# Patient Record
Sex: Male | Born: 1965 | Race: White | Hispanic: No | Marital: Single
Health system: Midwestern US, Academic
[De-identification: ages and names within clinical notes are randomized; demographics above are authoritative.]

---

## 2015-11-18 IMAGING — CT Head^1_WITH_WITHOUT_HEAD (Adult)
1 of 2 series · 13 of 30 positions shown, 17 images · IV contrast (APPLIED)
Comparison: none

[Series 1: brain w/o 4.8 brain · axial · non-contrast · 0.55mm/px · z∈[+128,+243]mm · 13 of 30 slices shown, 17 images]
[im 3/30  brain]
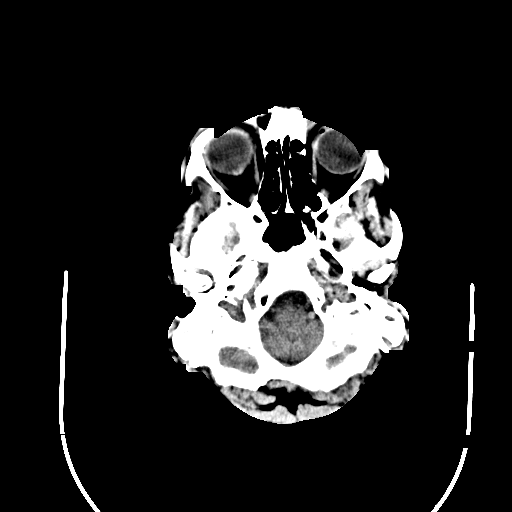
[im 3/30  bone]
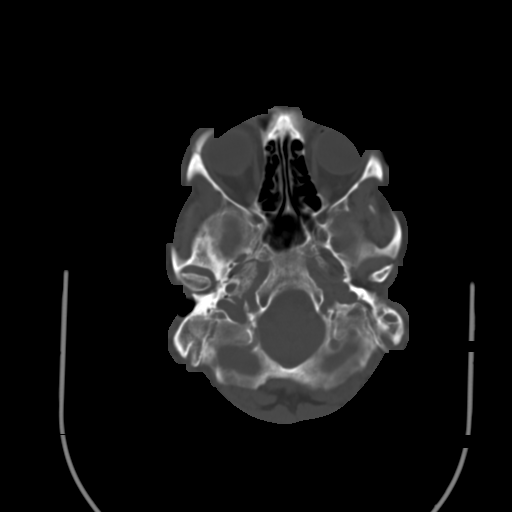
[im 5/30  brain]
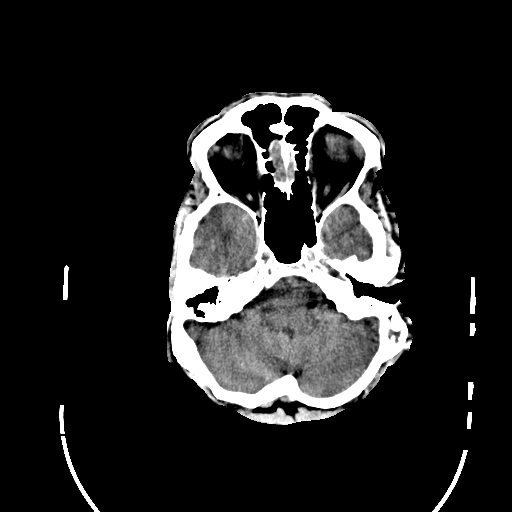
[im 7/30  brain]
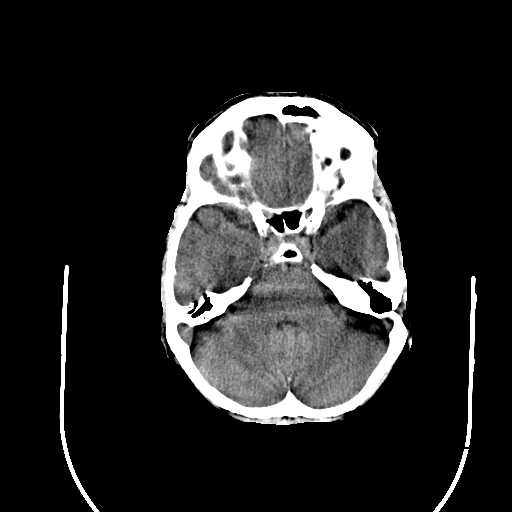
[im 9/30  brain]
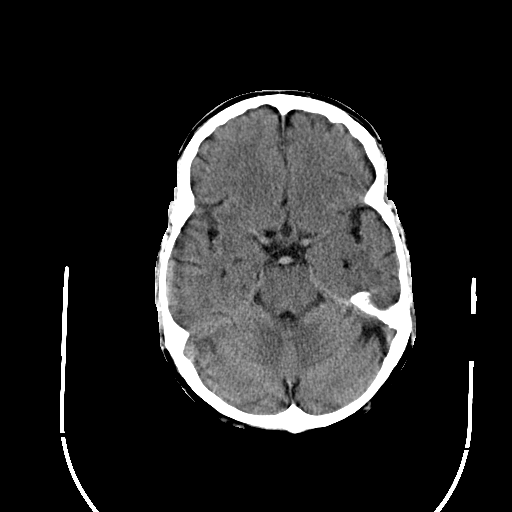
[im 11/30  brain]
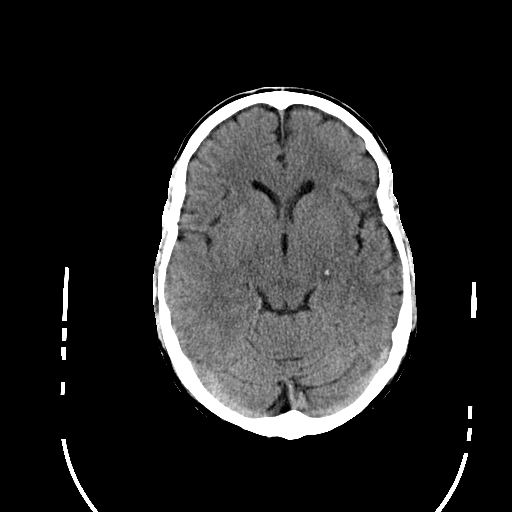
[im 11/30  bone]
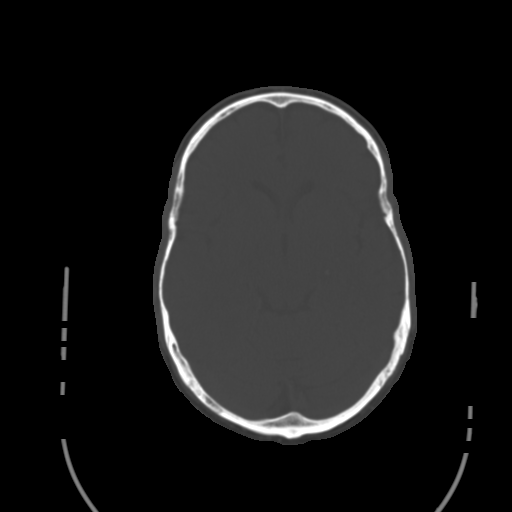
[im 13/30  brain]
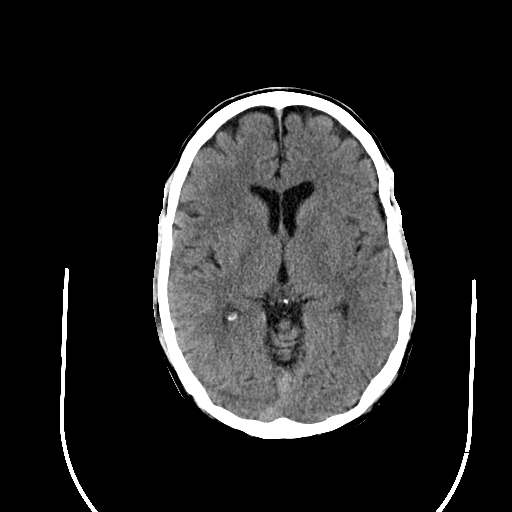
[im 15/30  brain]
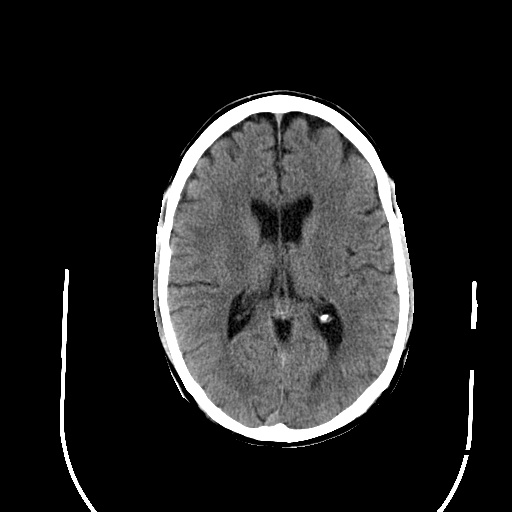
[im 17/30  brain]
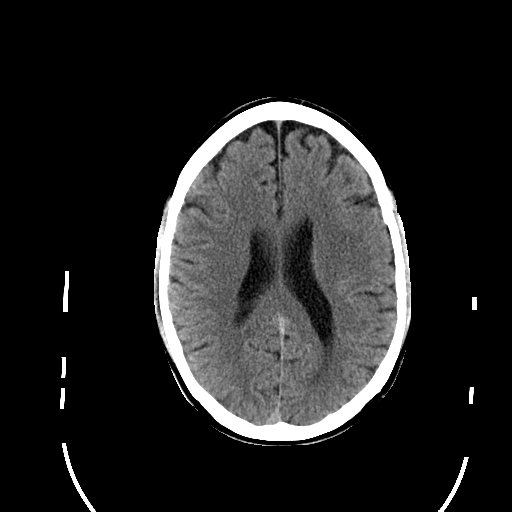
[im 19/30  brain]
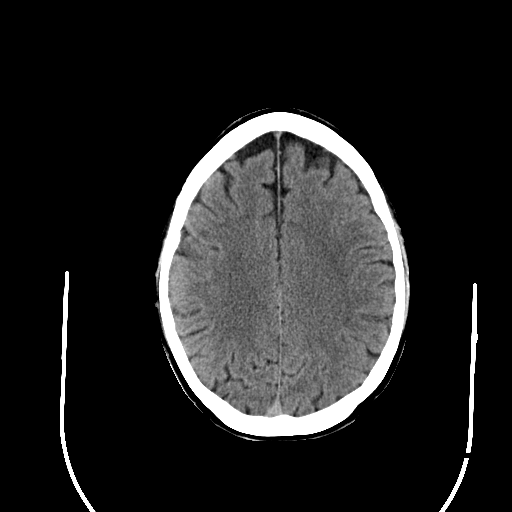
[im 19/30  bone]
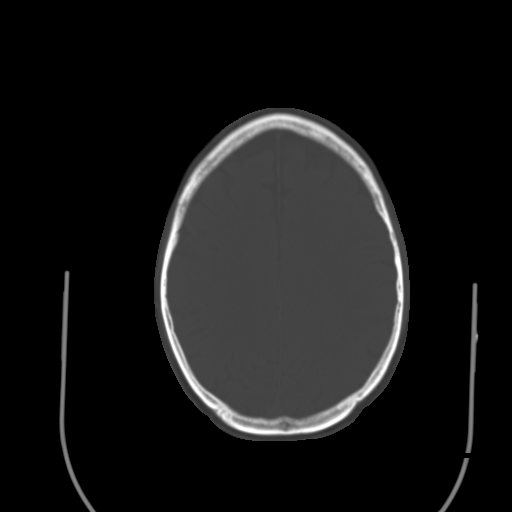
[im 21/30  brain]
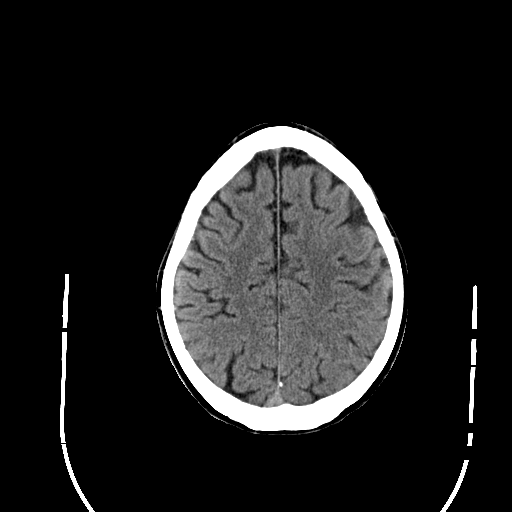
[im 23/30  brain]
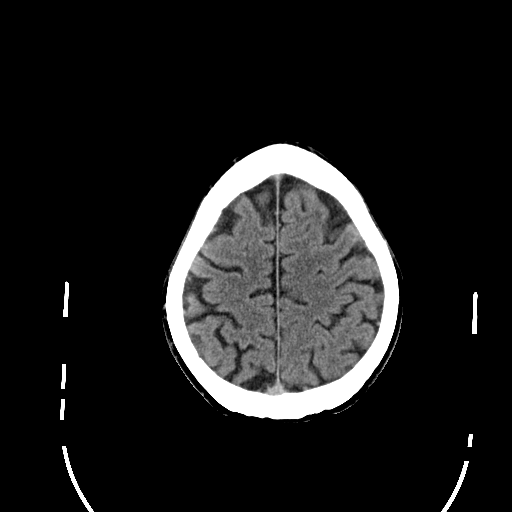
[im 25/30  brain]
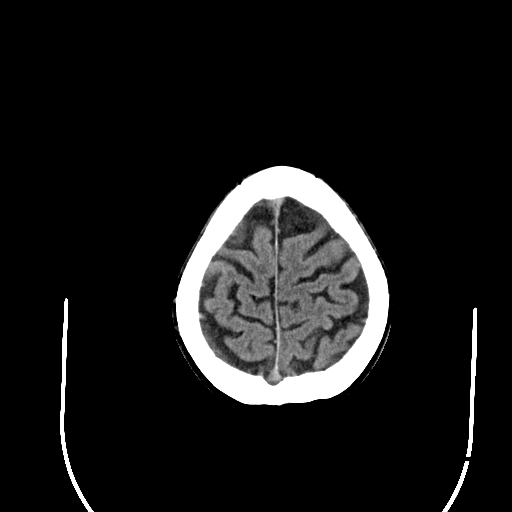
[im 27/30  brain]
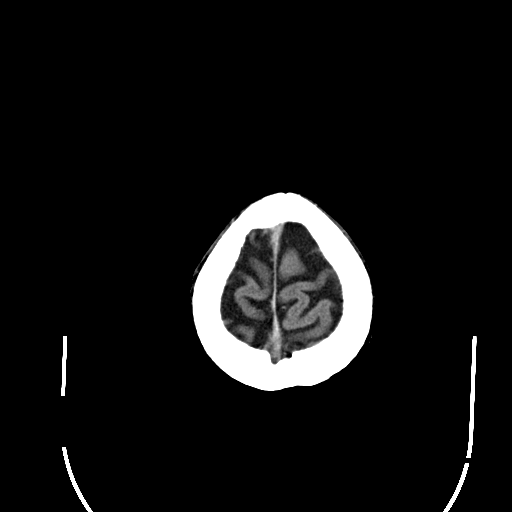
[im 27/30  bone]
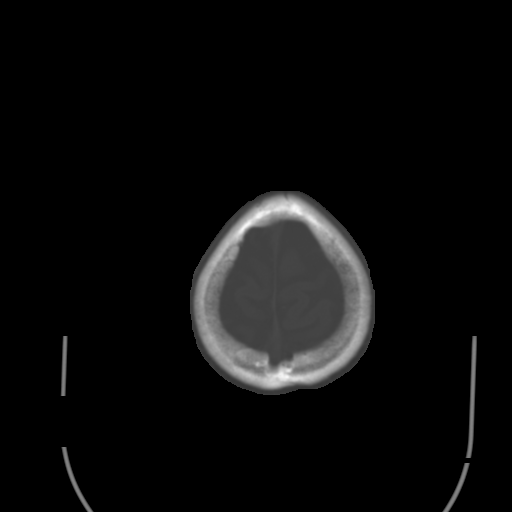

[13 of 30 positions shown; findings below may reference images not displayed]

EXAM
CT the brain with and without contrast.

INDICATION
Right-sided headaches.

FINDINGS/RESULTS
Axial images of the brain have been obtained with and without intravenous contrast.
The portions of the paranasal sinuses and mastoid air cells of that are visualized appear well
aerated.
The ventricles and sulci are within normal limits. There is no midline shift. There is no
hydrocephalus. There is no intracranial hemorrhage or hematoma.
No abnormally enhancing lesions are identified.

IMPRESSION
No hemorrhage or hydrocephalus. No abnormal enhancing lesions.

## 2015-11-18 IMAGING — CT Head^1_SINUS (Adult)
1 series · 15 of 30 positions shown, 19 images · non-contrast
Comparison: none

[Series 6: sinus 3.0 soft tissue · axial · 0.30mm/px · z∈[+66,+152]mm · 15 of 33 slices shown, 19 images]
[im 2/33  brain]
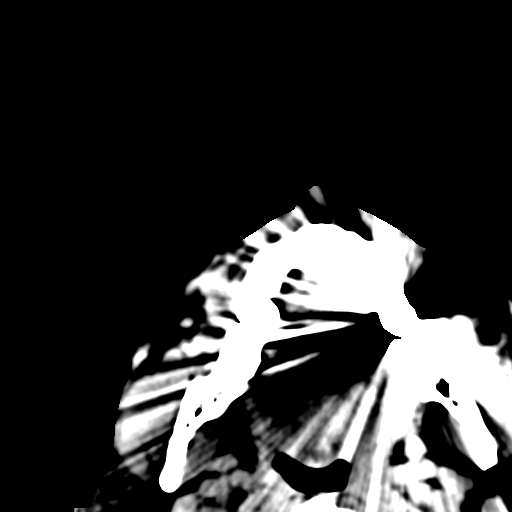
[im 2/33  bone]
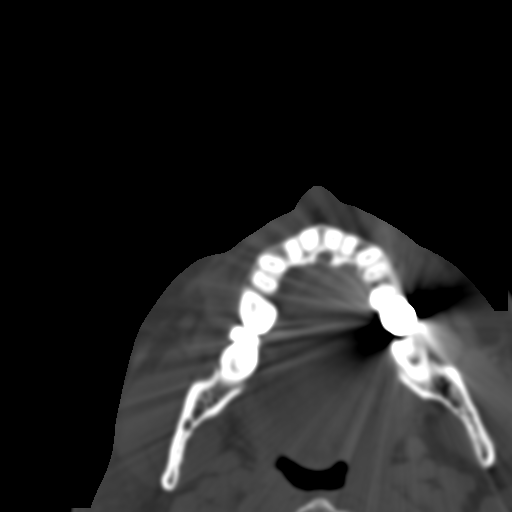
[im 4/33  bone]
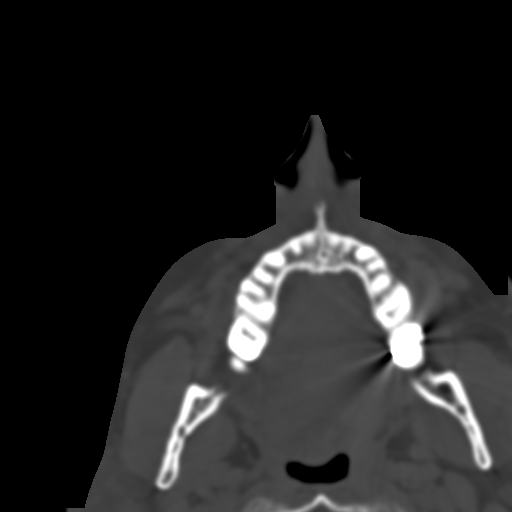
[im 6/33  bone]
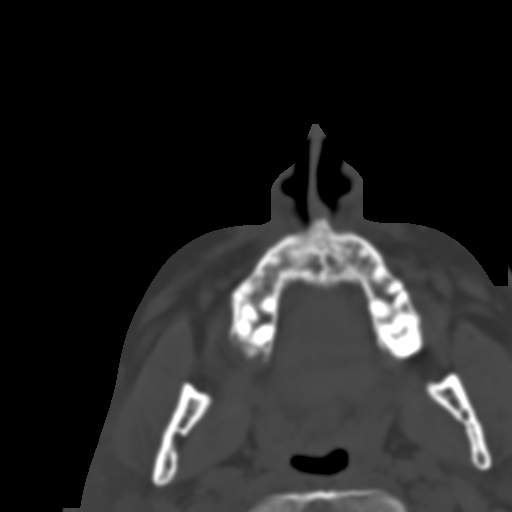
[im 8/33  bone]
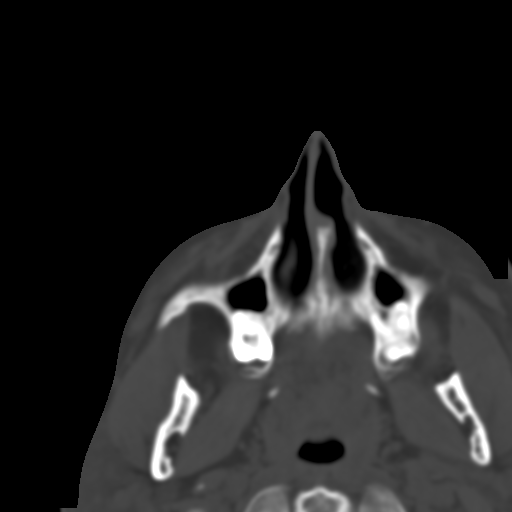
[im 10/33  brain]
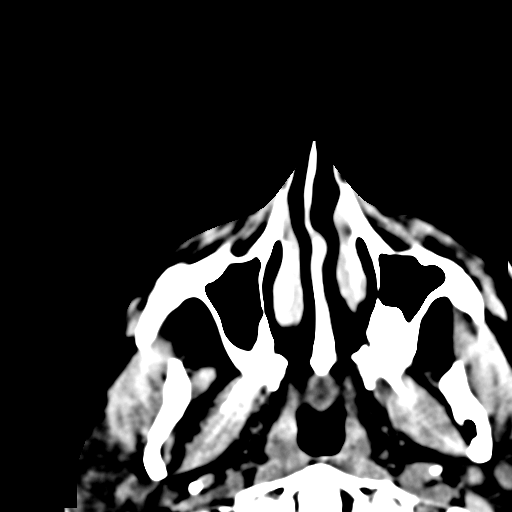
[im 10/33  bone]
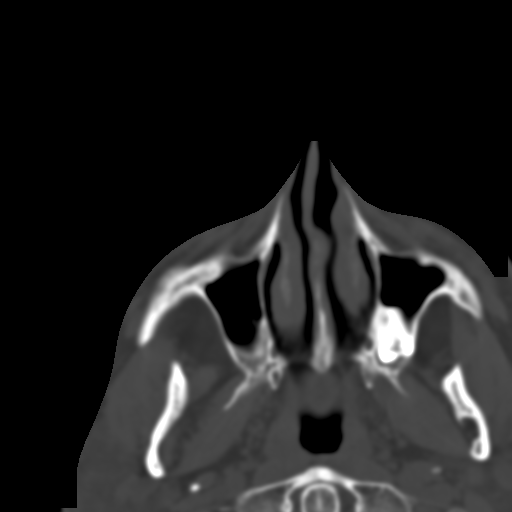
[im 13/33  bone]
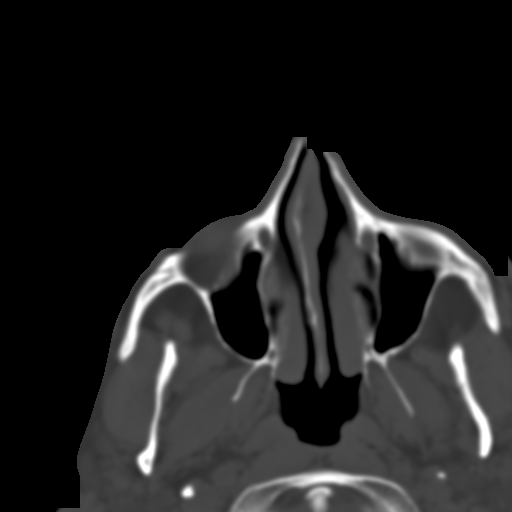
[im 15/33  bone]
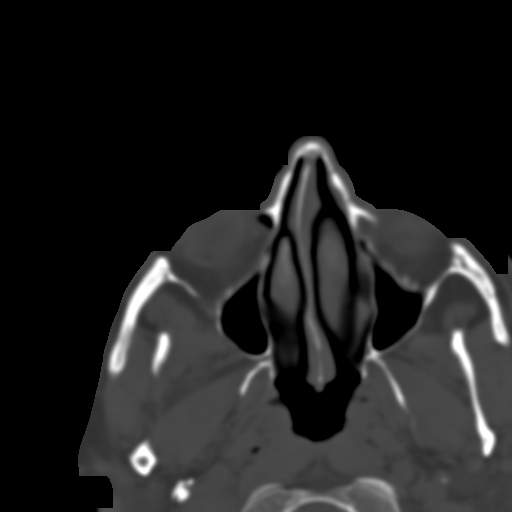
[im 17/33  bone]
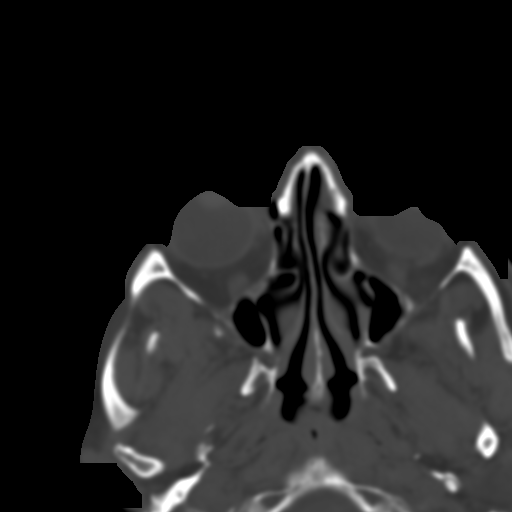
[im 18/33  brain]
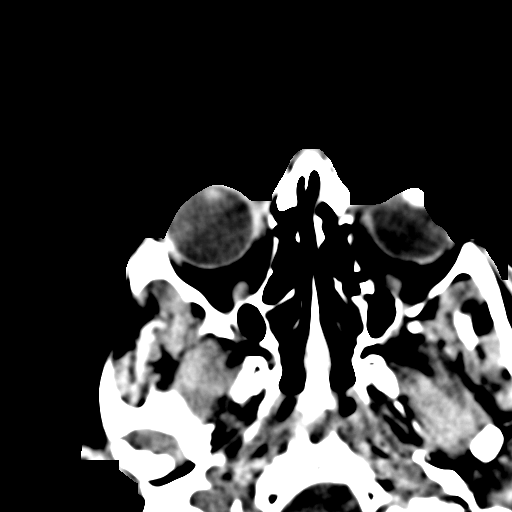
[im 18/33  bone]
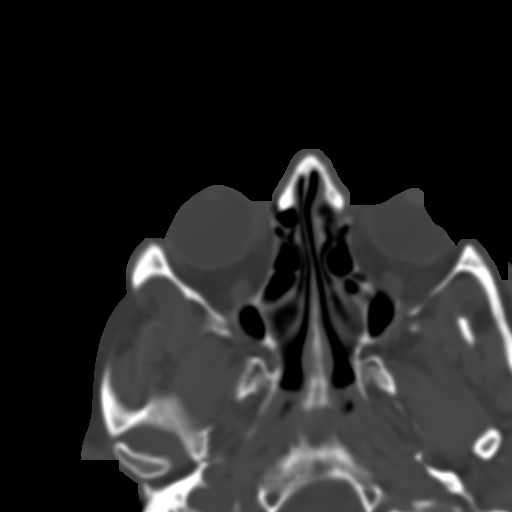
[im 20/33  bone]
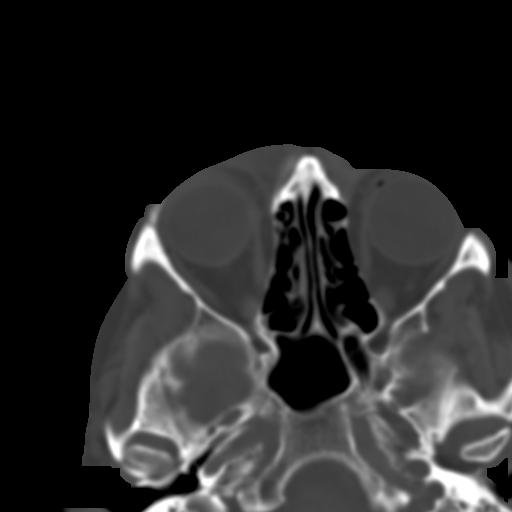
[im 23/33  bone]
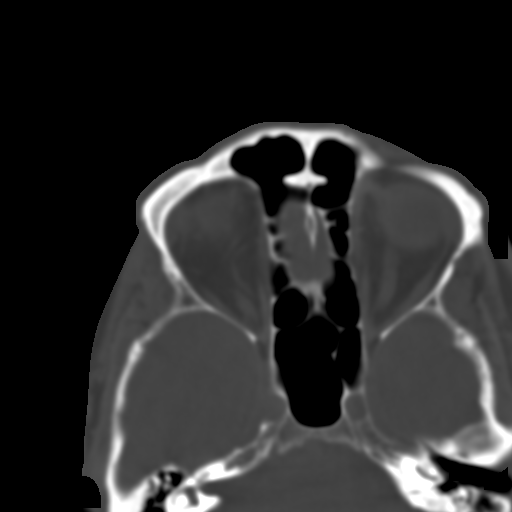
[im 25/33  bone]
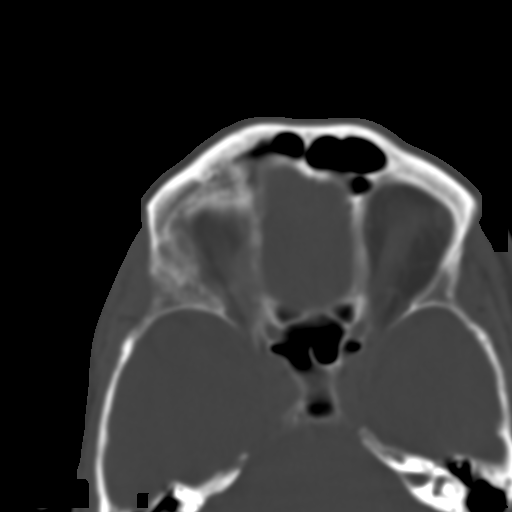
[im 27/33  brain]
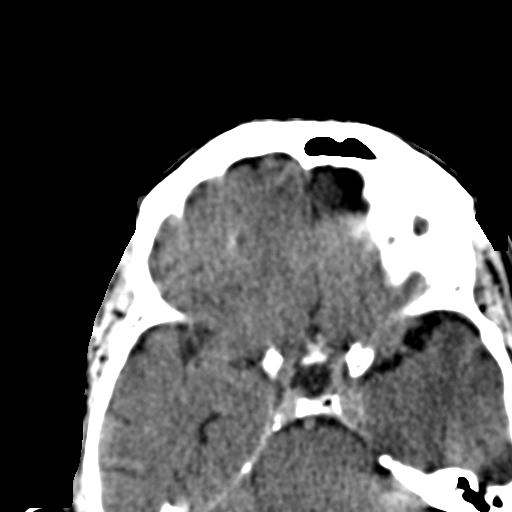
[im 27/33  bone]
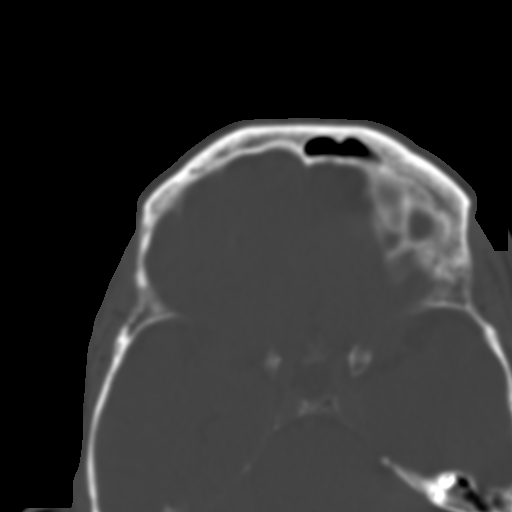
[im 29/33  bone]
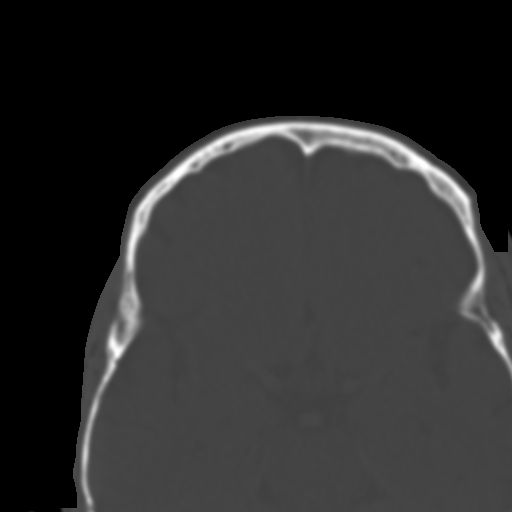
[im 31/33  bone]
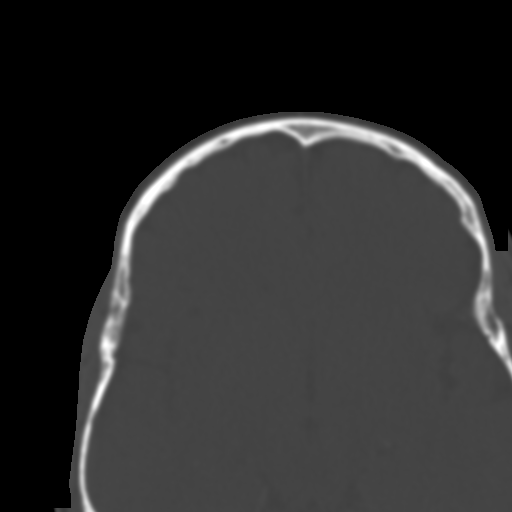

[15 of 30 positions shown; findings below may reference images not displayed]

------------- REPORT [id number] -------------
EXAM
CT paranasal sinuses.

INDICATION
headache
HEADACHES SINCE MAURI ON RIGHT SIDE OF HEAD. PT ON CPAP. LM

FINDINGS
CT of the paranasal sinuses was performed without contrast.
There is mild deviation of the nasal septum to the right.
There is very mild mucosal thickening in the left frontal sinus. The right frontal sinus is clear.
There is minimal mucosal thickening of the maxillary sinuses. The ethmoid sinuses are clear. There
is patency of the ostiomeatal complexes. The sphenoid sinus is clear.

IMPRESSION
There is very mild chronic sinusitis of the maxillary sinuses and left frontal sinus. There is
deviation of the nasal septum to the right. There is patency of the ostiomeatal complexes. There is
no bone destruction. There are no air-fluid levels.

------------- REPORT [id number] -------------
ADDENDUM
All CT scans at this facility use dose modulation, iterative reconstruction, and/or weight based
dosing when appropriate to reduce radiation dose to as low as reasonably achievable.

EXAM
CT paranasal sinuses.

INDICATION
headache
HEADACHES SINCE VATUVA ON RIGHT SIDE OF HEAD. PT ON CPAP. LM

FINDINGS
CT of the paranasal sinuses was performed without contrast.
There is mild deviation of the nasal septum to the right.
There is very mild mucosal thickening in the left frontal sinus. The right frontal sinus is clear.
There is minimal mucosal thickening of the maxillary sinuses. The ethmoid sinuses are clear. There
is patency of the ostiomeatal complexes. The sphenoid sinus is clear.

IMPRESSION
There is very mild chronic sinusitis of the maxillary sinuses and left frontal sinus. There is
deviation of the nasal septum to the right. There is patency of the ostiomeatal complexes. There is
no bone destruction. There are no air-fluid levels.

## 2019-04-13 IMAGING — CR PELVIS
3 series · 3 of 3 positions shown · non-contrast
Comparison: none

[pelvis]
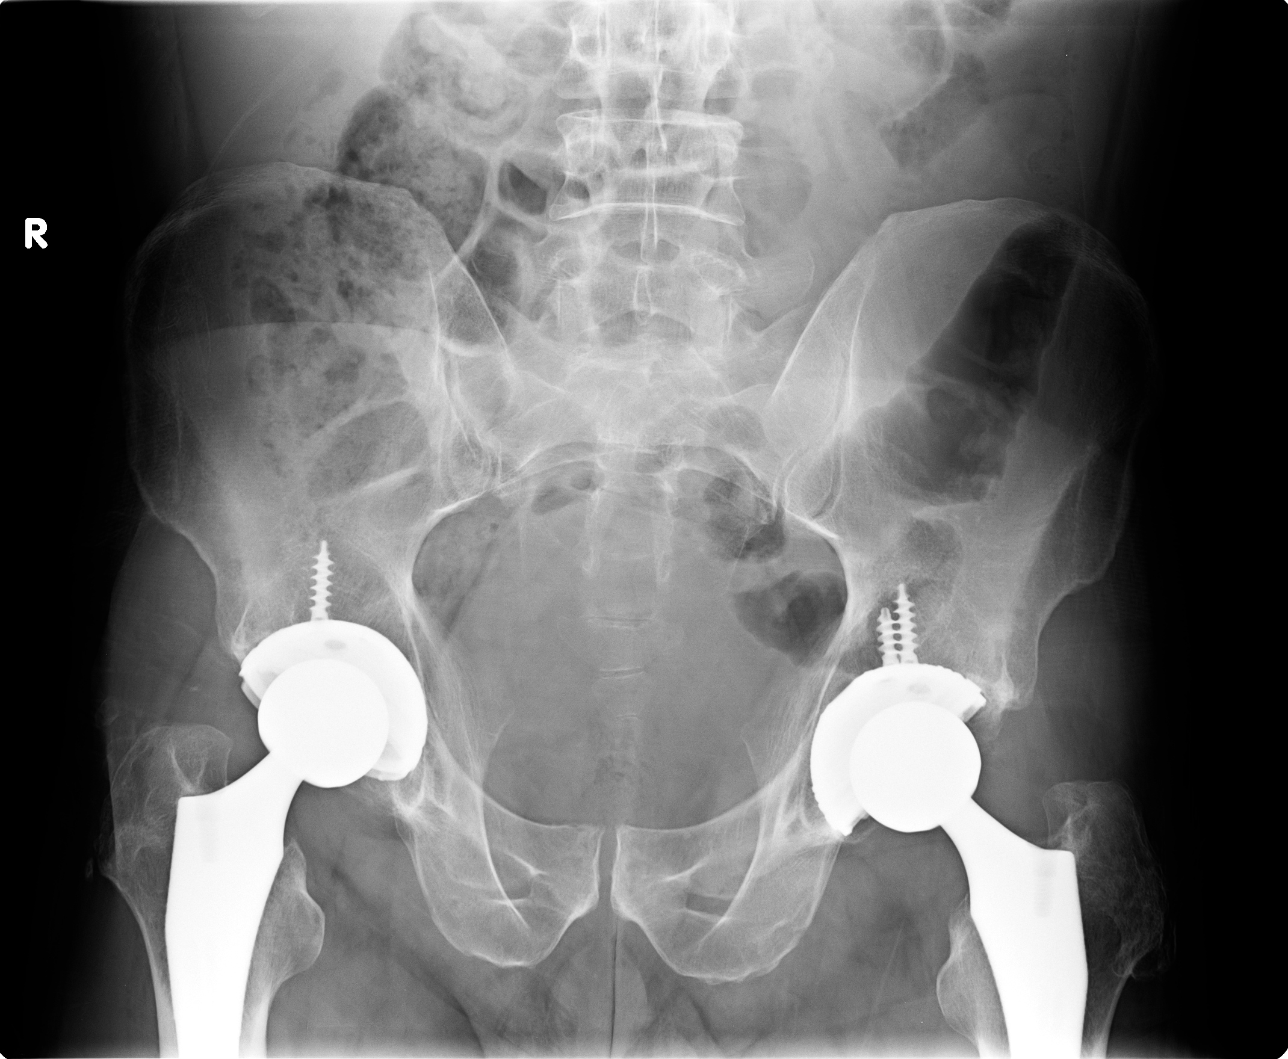

[hip ap]
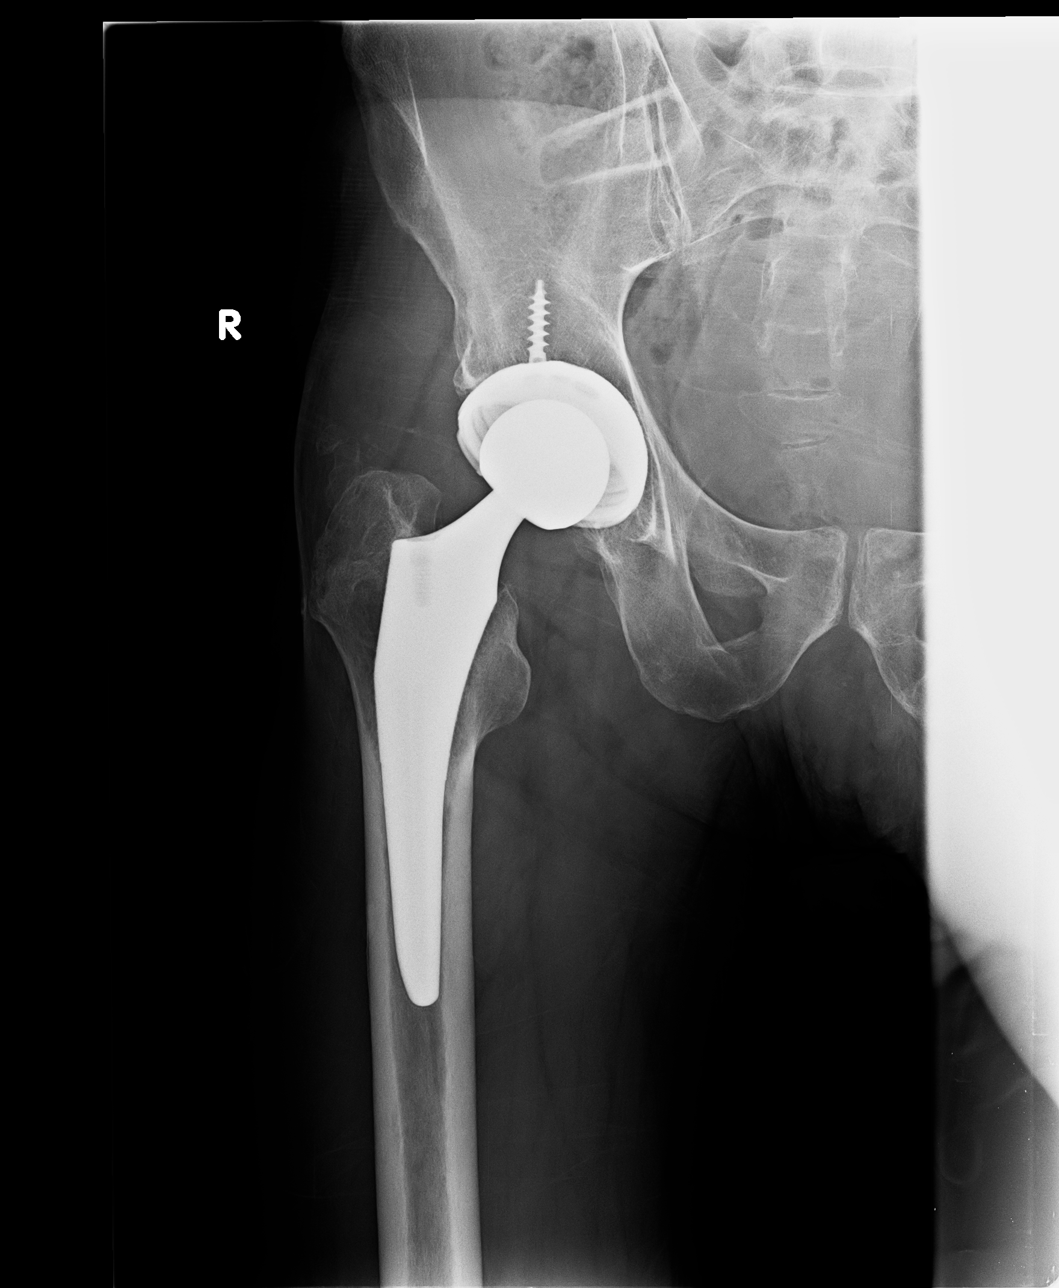

[hip frog]
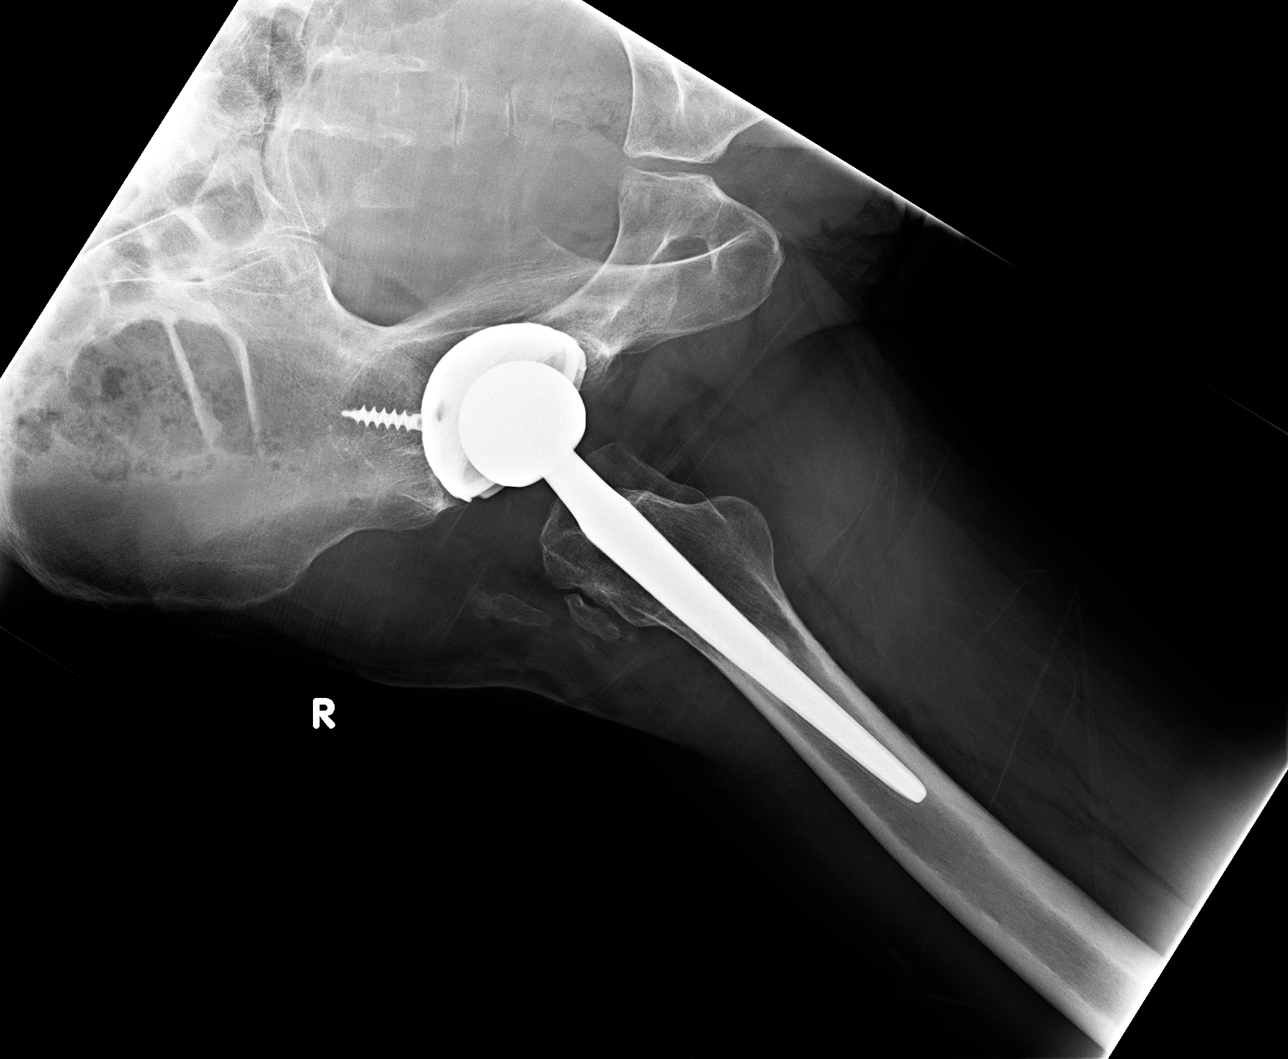

[3 of 3 positions shown; findings below may reference images not displayed]

EXAM
RADIOLOGICAL EXAMINATION, HIP; COMPLETE 2 VIEWS, AP PELVIS CPT 79684

INDICATION
Status post fall.Right hip pain. Fall. CC

TECHNIQUE
AP pelvis and 2 views of the right hip were acquired.

COMPARISONS
There are no previous examinations available for comparison.

FINDINGS
There are no fractures or subluxations of the bony pelvis or the right hip. Bilateral total hip
arthroplasties are noted. The orthopedic hardware appears intact. There are no abnormal masses or
calcifications. There are no blastic or lytic lesions.

IMPRESSION
There are no acute radiographic abnormalities of the right hip. Total right hip arthroplasty is
identified in place.

Tech Notes:

Pt c/o right hip pain. Fall. CC

## 2019-04-13 IMAGING — CT BRAIN WO(Adult)
3 of 4 series · 14 of 47 positions shown, 16 images · non-contrast
Comparison: none

PROCEDURE: BRAIN WO(Adult)
HISTORY: Fall. Hit head. Prior 11/18/15. CT/NM 0/0. CC
TECHNIQUE: Axial CT imaging of the brain was performed without contrast. Comparison
exam is dated 11/18/15.This exam was performed using one or more the following dose
reduction techniques: Automated exposure control, adjustment of the mA and/or KV
according to the patient's size or use of iterative reconstruction technique. Total DLP dose
measures 707 mGy with a total CTDI dose measuring 36 mGy.

[Series 2: brain ax 5.00 hr40 s3 · axial · 0.34mm/px · z∈[-517,-377]mm · 8 of 34 slices shown, 10 images]
[im 3/34  brain]
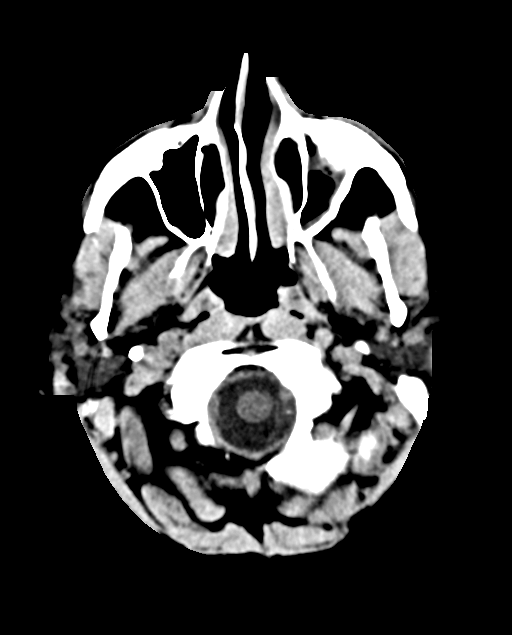
[im 3/34  bone]
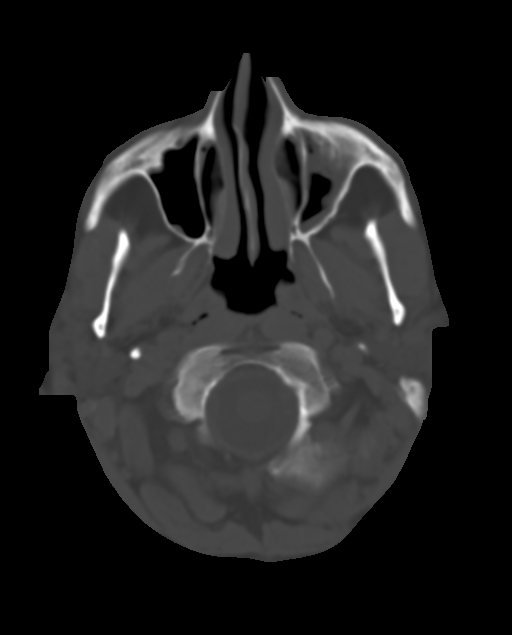
[im 8/34  brain]
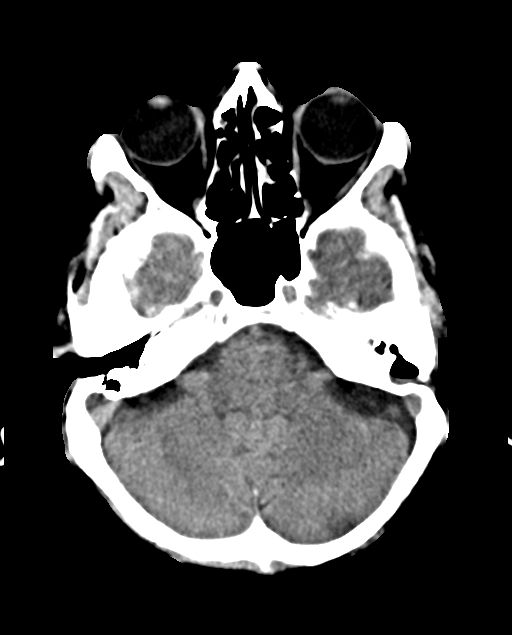
[im 12/34  brain]
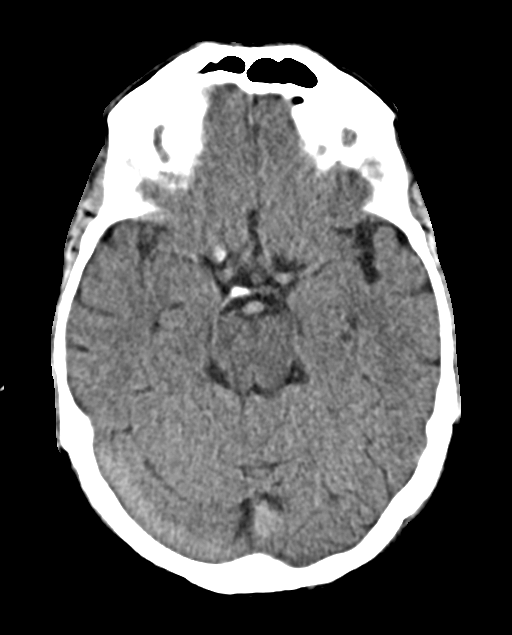
[im 15/34  brain]
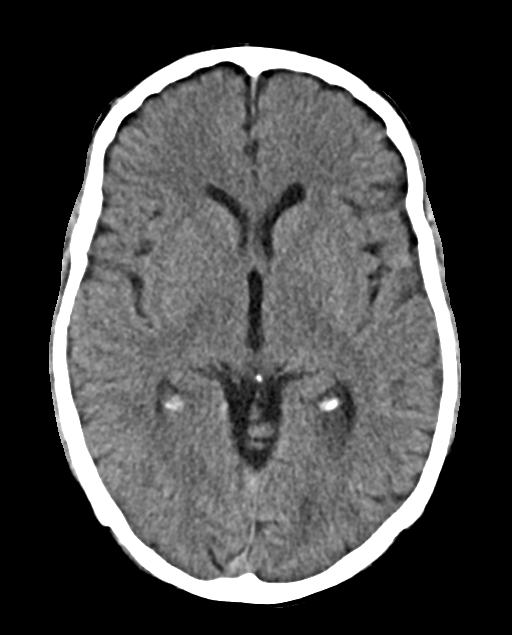
[im 19/34  brain]
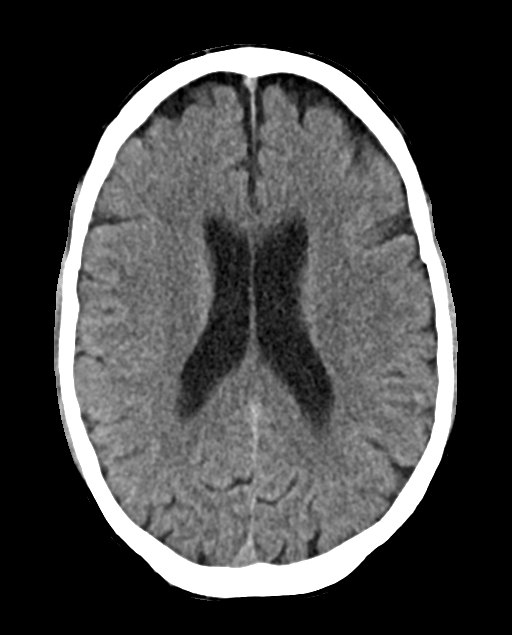
[im 19/34  bone]
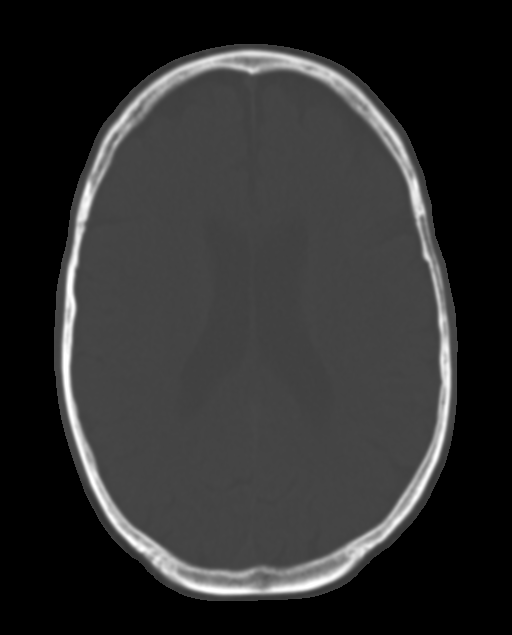
[im 22/34  brain]
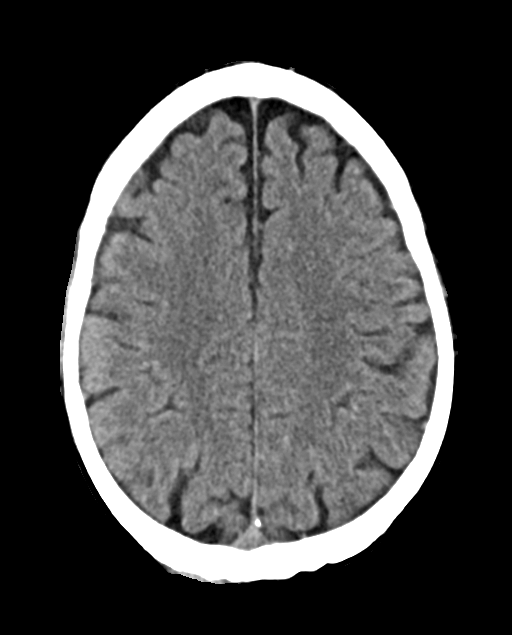
[im 26/34  brain]
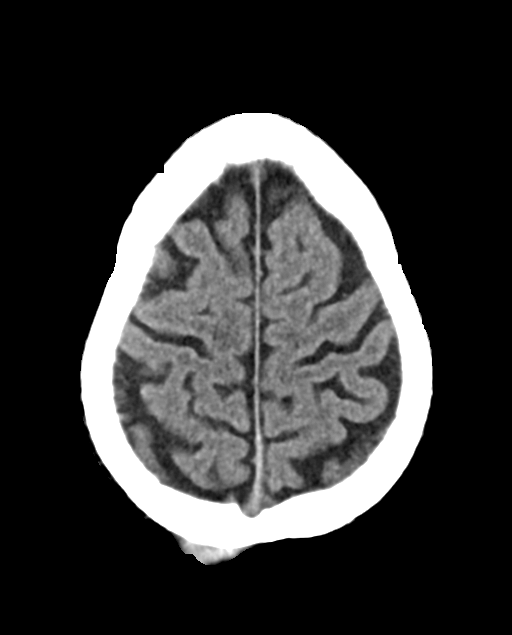
[im 31/34  brain]
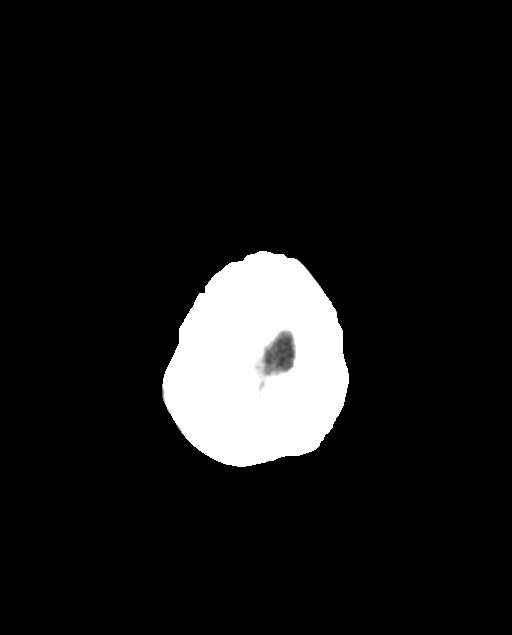

[Series 4: brain cor 5.00 hr40 s3 · coronal · 0.34mm/px · 3 of 43 slices shown]
[im 15/43  brain]
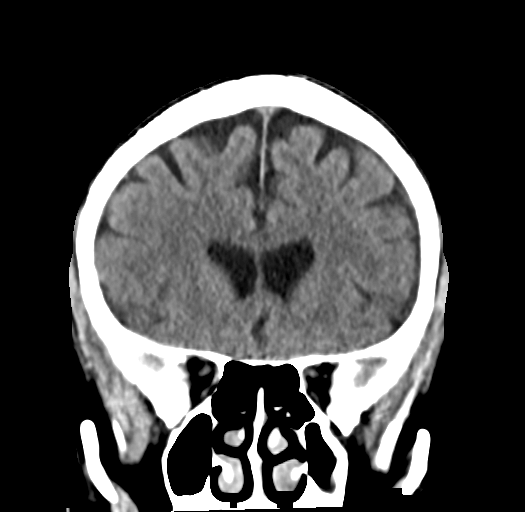
[im 19/43  brain]
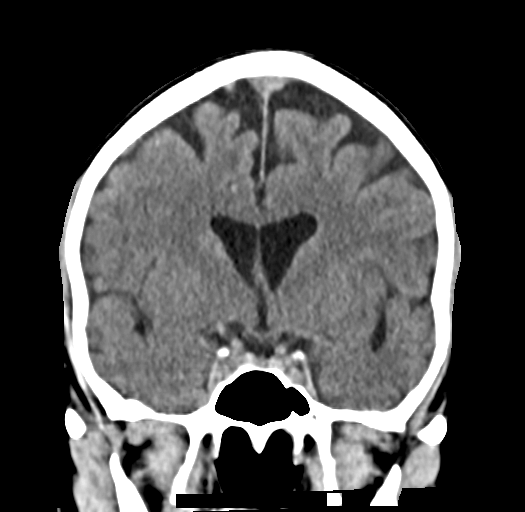
[im 24/43  brain]
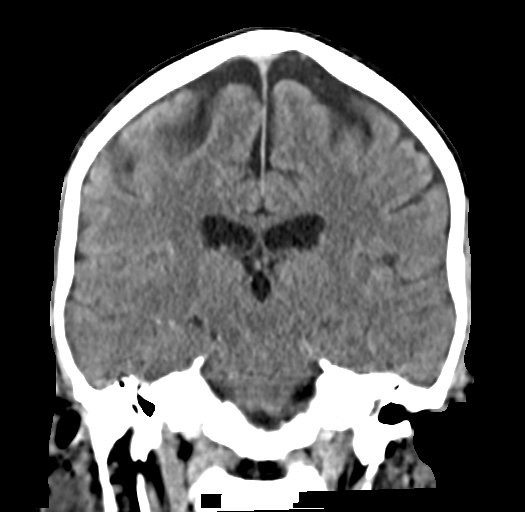

[Series 6: brain sag 5.00 hr40 s3 · sagittal · 0.34mm/px · 3 of 35 slices shown]
[im 12/35  brain]
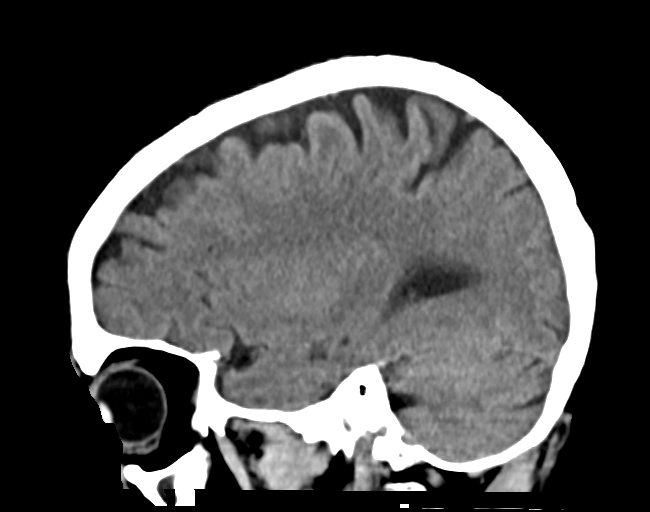
[im 18/35  brain]
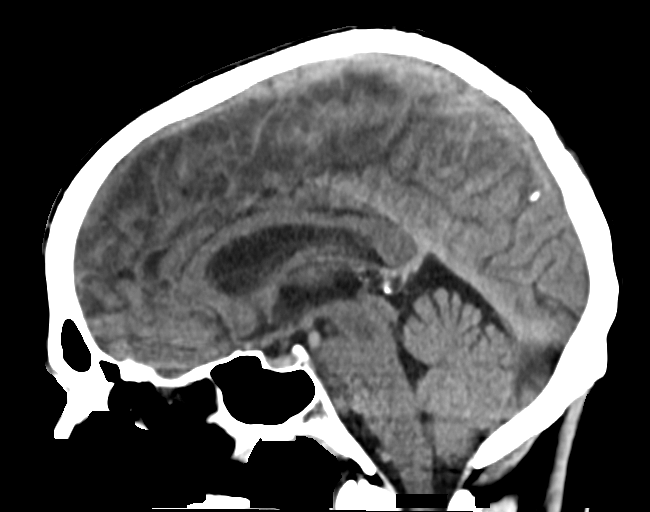
[im 23/35  brain]
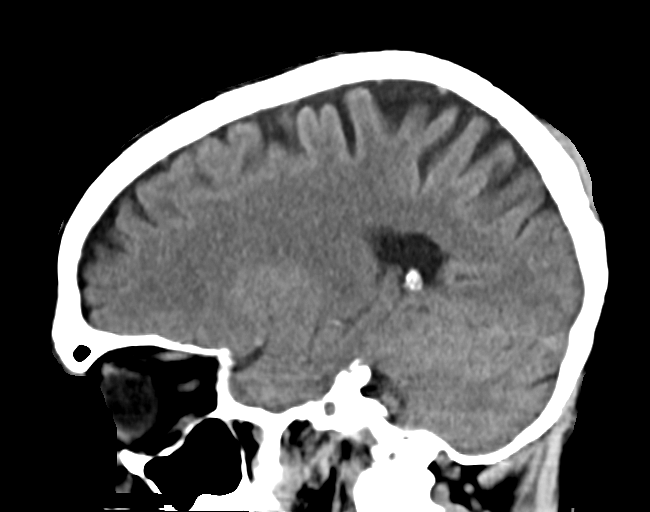

[14 of 47 positions shown; findings below may reference images not displayed]

FINDINGS: Right parietal scalp hematoma is seen with small laceration. There is good
gray white differentiation and sulcal detail. I don't see any acute large vessel distribution
infarction, intracranial bleed, or focal mass. Mild periventricular and subcortical
hypodensities are seen consistent with small vessel ischemic disease. The ventricles are
normal in size, shape, and midline in position. I don't see any cerebellopontine angle mass.
CT visualization of the brainstem, basal ganglia, and cerebellum are within normal limits.
No displaced skull fracture seen. The paranasal sinuses are clear.
IMPRESSION: 1. Small right parietal scalp hematoma is seen. No acute large vessel distribution
infarction, intracranial bleed, or focal mass seen.

2. Mild chronic periventricular and subcortical hypodensities seen which are consistent with
small vessel ischemic disease or the effects of vasculopathy.

Tech Notes:

Fall. Hit head. Prior 11/18/15. CT/NM 0/0. CC

## 2019-07-15 ENCOUNTER — Encounter: Admit: 2019-07-15 | Discharge: 2019-07-15

## 2019-07-16 NOTE — Telephone Encounter
07/16/19- Records Received from Dr Mitzi Davenport office have been scanned to chart and available in the [facility] button.  Thanks,  [place] Records Specialist

## 2019-07-17 NOTE — Telephone Encounter
07/16/19  Received records from [place] ([place]) for appointment with Dr. Ricard Dillon, records have been scanned into the patients chart via [employer].  / sjg

## 2019-07-18 ENCOUNTER — Ambulatory Visit: Admit: 2019-07-18 | Discharge: 2019-07-18

## 2019-07-18 ENCOUNTER — Encounter: Admit: 2019-07-18 | Discharge: 2019-07-18

## 2019-07-18 DIAGNOSIS — F419 Anxiety disorder, unspecified: Secondary | ICD-10-CM

## 2019-07-18 DIAGNOSIS — Z8249 Family history of ischemic heart disease and other diseases of the circulatory system: Secondary | ICD-10-CM

## 2019-07-18 DIAGNOSIS — Z7289 Other problems related to lifestyle: Secondary | ICD-10-CM

## 2019-07-18 DIAGNOSIS — G4733 Obstructive sleep apnea (adult) (pediatric): Secondary | ICD-10-CM

## 2019-07-18 DIAGNOSIS — I10 Essential (primary) hypertension: Secondary | ICD-10-CM

## 2019-07-18 DIAGNOSIS — E785 Hyperlipidemia, unspecified: Secondary | ICD-10-CM

## 2019-07-18 DIAGNOSIS — K219 Gastro-esophageal reflux disease without esophagitis: Secondary | ICD-10-CM

## 2019-07-18 NOTE — Patient Instructions
1.  Goal blood pressure is average 130/80 or less.  You could bring your home BP into the clinic to determine if it's accurate.  2.  We'll set up a lipid profile to check cholesterol level  3.  If we can help with smoking cessation we'd be happy to help.

## 2019-07-18 NOTE — Assessment & Plan Note
I have no idea what his blood pressure is running.  He talked somewhat incoherently for a minute after I asked about home BP readings.

## 2019-07-18 NOTE — Assessment & Plan Note
I think he was probably intoxicated during the visit today.

## 2019-07-18 NOTE — Progress Notes
Date of Service: 07/18/2019    Jonathan Crosby is a 53 y.o. male.   Medical assistant/scheduling obtained patient's verbal consent to treat them and their agreement to [facility] financial policy and NPP via this telehealth visit during the [facility] Emergency. This video home visit was conducted via a call between the patient and physician/provider due to COVID-19. We have found that certain health care needs can be provided without an in-person visit.. This service lets [place] provide the care patients' need.  If a prescription is necessary we can send it directly to their pharmacy.  If testing is necessary this can be arranged.        Additional time was spent on reviewing interim medical documentation and coordination of care  Provider Call Started at 1615  Provider Call Ended at 1645      HPI     Jonathan Crosby was self-referred for today's Zoom visit for evaluation of intermittent left arm pain.  He's a patient of Dr. Andreas Newport who is a long-time alcoholic and smoker.  He's been followed by the [facility] group and he had a stress echocardiogram about 2 years ago at [facility].  His dad had a heart attack about a week ago.    He's going to probably need surgery for repair of a hip arthroplasty.    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  He spends a lot of time on ladders.  He's a single-parent who has 2 kids, one in college and one in high school.    He says that he is interested in doing what he can to be as healthy as possible.  He says that he is starting an outpatient rehab program next week for substance abuse.  He says that he will tackle the smoking later.       Vitals:    07/18/19 1601   Weight: 64.4 kg (142 lb)   Height: 1.803 m (5' 11)   PainSc: Zero     Body mass index is 19.8 kg/m???.     Past Medical History  Patient Active Problem List    Diagnosis Date Noted   ??? Chest pain 07/18/2019 10/18/2015  Exercise stress test:  No exercise induced chest pain.  No ECG evidence of ischemia.  Low risk study.       ??? 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 Hypertension 07/18/2019     04/11/2012 Stress Echo:  No evidence of ischemia.  No echocardiographic evidence of ischemia.  Medical treatment with lifestyle and risk factor modifications.      ??? Family history of premature CAD 07/18/2019   ??? OSA on CPAP 07/18/2019   ??? Acid reflux 07/18/2019   ??? Anxiety 07/18/2019         Review of Systems   Constitution: Positive for decreased appetite, malaise/fatigue and weight loss.   HENT: Positive for stridor and tinnitus.    Eyes: Negative.    Cardiovascular: Positive for chest pain, dyspnea on exertion and irregular heartbeat.   Respiratory: Positive for cough, shortness of breath, sputum production and wheezing.    Endocrine: Negative.    Hematologic/Lymphatic: Negative.    Skin: Negative.    Musculoskeletal: Positive for back pain, joint pain, muscle weakness, myalgias and neck pain.   Gastrointestinal: Positive for anorexia.   Genitourinary: Negative.    Neurological: Positive for excessive daytime sleepiness, headaches, numbness, paresthesias and weakness.   Psychiatric/Behavioral: Positive for substance abuse. The patient is nervous/anxious.  Allergic/Immunologic: Negative.        Physical Exam    General:  Resting comfortably and in no apparent distress  HEENT:  Extra-ocular movements intact and oropharynx appears clear  Neck:  Neck veins are flat and not distended  Chest:  Chest is normal in appearance  Respiratory:  Normal, unlabored breathing  Extremities:  No lower extremity swelling  Skin:  Intact with no visible rash, discoloration, or wounds  Digits and Nails:  No clubbing  Neurologic:  Oriented to time, place, and person.  No dysarthria.  No facial asymmetry.  Psychiatric:  Mood and affect are normal  Other:  Moves all extremities        Problems Addressed Today  Encounter Diagnoses   Name Primary? ??? Essential hypertension    ??? Chronic alcohol use    ??? Family history of premature CAD        Assessment and Plan       Hypertension  I have no idea what his blood pressure is running.  He talked somewhat incoherently for a minute after I asked about home BP readings.    Chronic alcohol use  I think he was probably intoxicated during the visit today.    Family history of premature CAD  He will need a lipid profile.  I offered Chantix for smoking cessation but he is not interested at the present time.  I mentioned the possibility of a coronary calcium scan if his stress EKG looks normal.      Current Medications (including today's revisions)  ??? chlordiazePOXIDE (LIBRIUM) 25 mg capsule Take 25 mg by mouth as Needed for Anxiety.   ??? cloNIDine (CATAPRESS) 0.1 mg tablet Take 0.1 mg by mouth daily.   ??? lisinopriL (ZESTRIL) 40 mg tablet Take 40 mg by mouth daily.   ??? LORazepam (ATIVAN) 0.5 mg tablet Take 1 tablet by mouth three times daily as needed for Nausea.   ??? NIFEdipine XL (PROCARDIA-XL) 30 mg tablet Take 1 tablet by mouth daily.   ??? pantoprazole DR (PROTONIX) 40 mg tablet Take 1 tablet by mouth twice daily.   ??? sertraline (ZOLOFT) 50 mg tablet Take 50 mg by mouth daily.   ??? tadalafiL (CIALIS) 20 mg tablet Take 20 mg by mouth as Needed for Erectile dysfunction.   ??? testosterone (ANDROGEL PUMP) 1.62 % (20.25 mg/1.25 g) transdermal gel Daily   ??? thiamine HCL (VITAMIN B-1) 100 mg tablet Take 100 mg by mouth twice daily.   ??? traMADoL (ULTRAM) 50 mg tablet Take 50-100 mg by mouth every 6-8 hours as needed for Pain.

## 2019-07-19 ENCOUNTER — Encounter: Admit: 2019-07-19 | Discharge: 2019-07-19

## 2019-07-19 DIAGNOSIS — R079 Chest pain, unspecified: Secondary | ICD-10-CM

## 2019-07-19 DIAGNOSIS — Z8249 Family history of ischemic heart disease and other diseases of the circulatory system: Secondary | ICD-10-CM

## 2019-07-19 NOTE — Assessment & Plan Note
He will need a lipid profile.  I offered Chantix for smoking cessation but he is not interested at the present time.  I mentioned the possibility of a coronary calcium scan if his stress EKG looks normal.

## 2019-07-19 NOTE — Progress Notes
Lab order faxed to [facility] lab and copy of order mailed to patient with a copy of his AVS.

## 2019-07-23 ENCOUNTER — Encounter: Admit: 2019-07-23 | Discharge: 2019-07-23

## 2019-07-23 NOTE — Telephone Encounter
TM only order faxed to [facility] scheduling.  Lab order faxed to [facility] lab.  LM on vm for patient regarding plan of care.  [facility] scheduling will call pt to schedule and pt can have labs done when he is at the hospital for TM only.  No further needs at this time.

## 2019-08-07 NOTE — Telephone Encounter
Called to see if testing has been completed.  Pt has not scheduled treadmill only.  Order refaxed to scheduling.  Scheduling will call pt to schedule.  Await results.

## 2019-08-13 NOTE — Telephone Encounter
Called [facility] scheduling.  Pt requested not to schedule at this time.  He told scheduling staff he would call back when he is ready.  Will await for pt to call back to schedule.

## 2019-08-20 ENCOUNTER — Encounter: Admit: 2019-08-20 | Discharge: 2019-08-20

## 2019-08-20 DIAGNOSIS — Z8249 Family history of ischemic heart disease and other diseases of the circulatory system: Secondary | ICD-10-CM

## 2019-08-20 DIAGNOSIS — R079 Chest pain, unspecified: Secondary | ICD-10-CM

## 2019-08-20 LAB — LIPID PROFILE
Lab: 14
Lab: 168 — ABNORMAL HIGH (ref <100)
Lab: 226 — ABNORMAL HIGH (ref <200)
Lab: 44
Lab: 5 — AB
Lab: 71

## 2019-08-30 ENCOUNTER — Encounter: Admit: 2019-08-30 | Discharge: 2019-08-30 | Payer: No Typology Code available for payment source

## 2019-08-30 NOTE — Progress Notes
Treadmill Stress Test - 08/22/2019 at [place]:  1. Normal Sinus rhythm.  Poor R-wave progression.  2. No ECG evidence of ischemia.  3. Medical Therapy with life style modification.    Report sent for scanning.    Will route to Dr. Ricard Dillon for review and recommendations

## 2019-09-02 ENCOUNTER — Encounter: Admit: 2019-09-02 | Discharge: 2019-09-02 | Payer: No Typology Code available for payment source

## 2019-09-02 NOTE — Telephone Encounter
Left message with normal results and left call back to discuss blood pressure.

## 2019-09-02 NOTE — Telephone Encounter
-----   Message from Baldwin Crown, RN sent at 09/02/2019 12:30 PM CDT -----    ----- Message -----  From: Michiel Cowboy, MD  Sent: 09/02/2019   7:47 AM CDT  To: Baldwin Crown, RN    Please let him know the stress test was OK.  Is he checking home BP?  ----- Message -----  From: Baldwin Crown, RN  Sent: 08/30/2019   8:45 AM CDT  To: Michiel Cowboy, MD

## 2019-09-03 ENCOUNTER — Encounter: Admit: 2019-09-03 | Discharge: 2019-09-03 | Payer: No Typology Code available for payment source

## 2019-09-03 MED ORDER — ATORVASTATIN 40 MG PO TAB
40 mg | ORAL_TABLET | Freq: Every day | ORAL | 3 refills | Status: AC
Start: 2019-09-03 — End: ?

## 2019-09-03 NOTE — Telephone Encounter
Called and discussed results with patient.  He is agreeable to plan.  New script sent to pharmacy.  Flag sent to [facility]. Joe RNs to mail labs in 2 months for follow up on FLP.  Instructed pt to call us with any adverse effects of the medication.

## 2019-09-03 NOTE — Telephone Encounter
-----   Message from Michiel Cowboy, MD sent at 09/03/2019  6:18 AM CDT -----  We should start atorva 40 mg/day with f/up lipid profile, AST, and ALT in about 2 months if he's willing.  ----- Message -----  From: Baldwin Crown, RN  Sent: 08/20/2019  11:36 AM CDT  To: Michiel Cowboy, MD    Labs for your review, pt is not currently on a statin.  Please let me know your recommendations.  Thank you!

## 2019-11-06 ENCOUNTER — Encounter: Admit: 2019-11-06 | Discharge: 2019-11-06 | Payer: No Typology Code available for payment source

## 2020-08-13 IMAGING — CR CHEST
1 series · 1 of 1 positions shown · non-contrast
Comparison: none

[chest port]
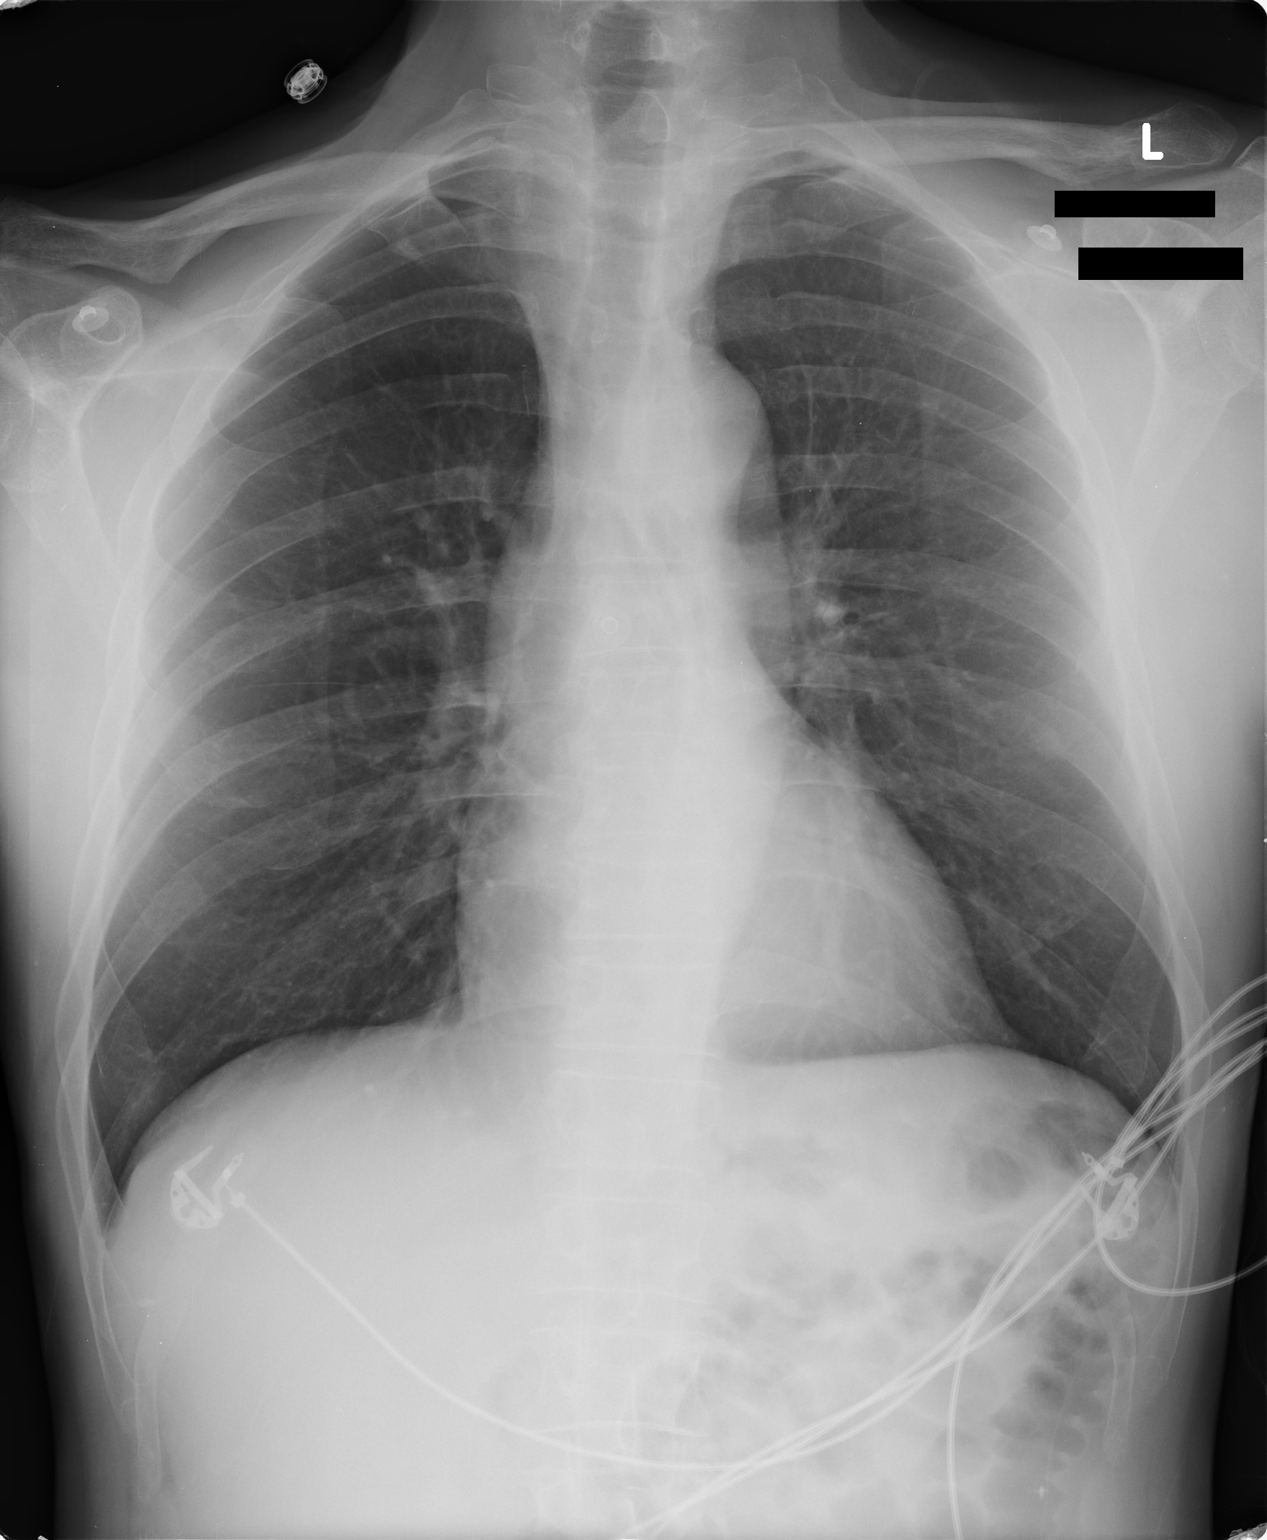

[1 of 1 positions shown; findings below may reference images not displayed]

DIAGNOSTIC STUDIES

EXAM

XR chest 1V

INDICATION

cough
abnormal EKG, chest pain

TECHNIQUE

Single-view

COMPARISONS

June 22, 2020

FINDINGS

No focal pneumonia. No pneumothorax. The heart size is normal. Pulmonary vasculature is
unremarkable. No acute osseous findings.

IMPRESSION

No acute cardiopulmonary findings.

Tech Notes:

abnormal EKG, chest pain

## 2020-12-14 IMAGING — CR [ID]
1 series · 1 of 1 positions shown · non-contrast
Comparison: none

[chest ap]
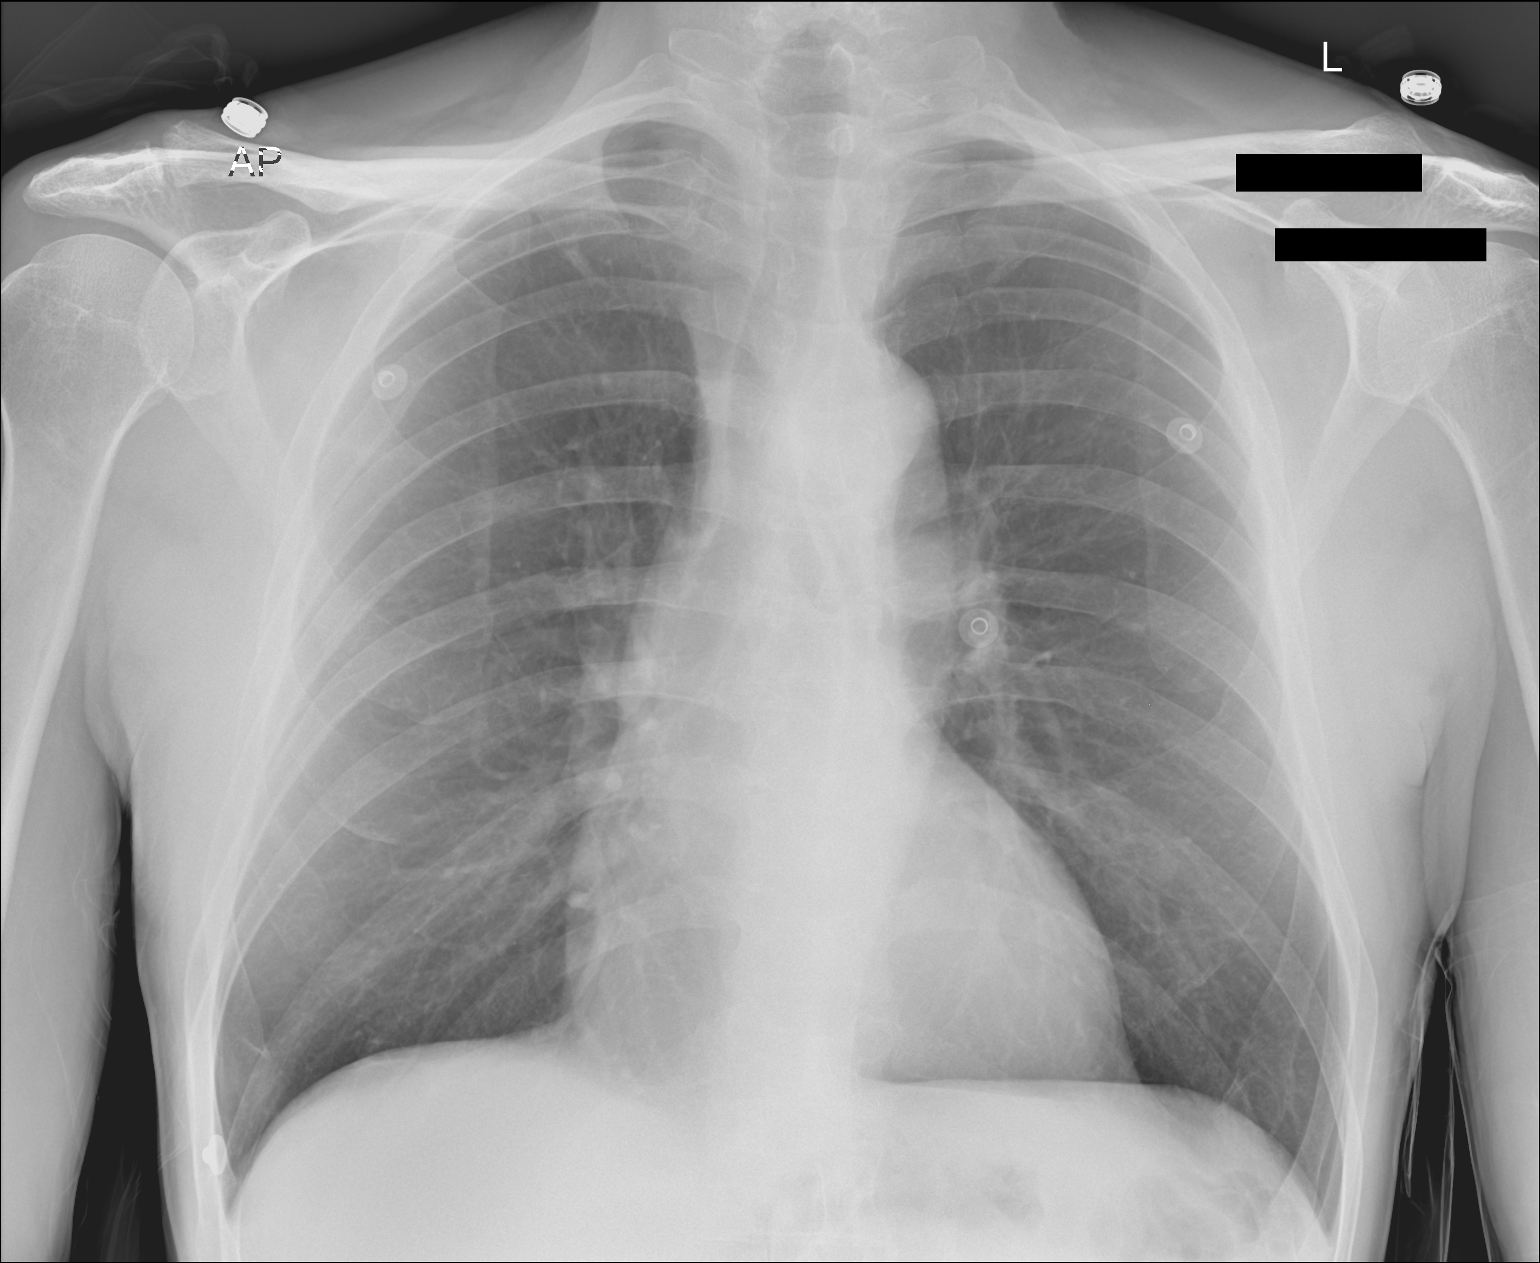

[1 of 1 positions shown; findings below may reference images not displayed]

DIAGNOSTIC STUDIES

EXAM

XR chest 1V

INDICATION

chest pain
Pt c/o cough, shortness of air, fatigue, and chest pain x 5 days. ME/CF

TECHNIQUE

Portable chest.

COMPARISONS

August 13, 2020

FINDINGS

Cardiomediastinal silhouette is within normal limits. No acute infiltrates are seen. Osseous
structures are unchanged.

IMPRESSION

No evidence for active disease in the chest.

Tech Notes:

Pt c/o cough, shortness of air, fatigue, and chest pain x 5 days. ME/CF

## 2020-12-25 IMAGING — CT SINUSWO
3 of 4 series · 12 of 47 positions shown, 14 images · non-contrast
Comparison: none

[Series 502: sinus ax 3.00 hr60 s3 · axial · 0.28mm/px · z∈[-572,-504]mm · 6 of 33 slices shown, 8 images]
[im 5/33  brain]
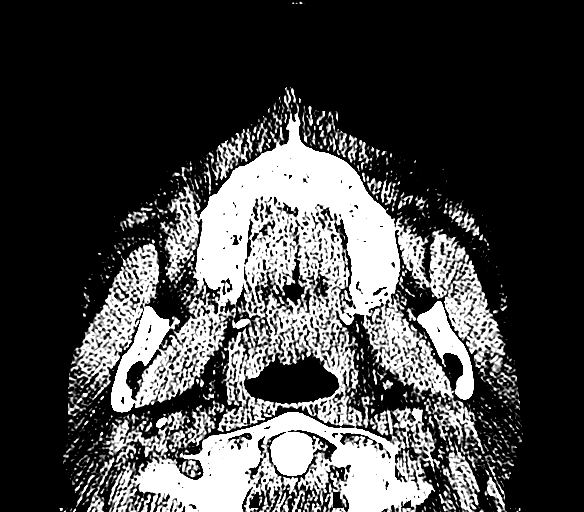
[im 5/33  bone]
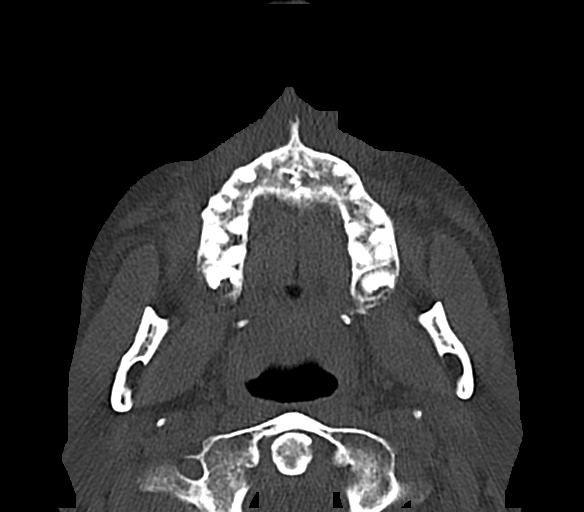
[im 10/33  bone]
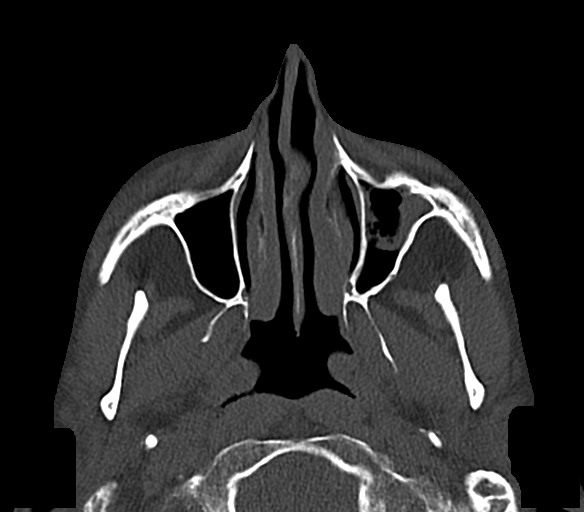
[im 14/33  bone]
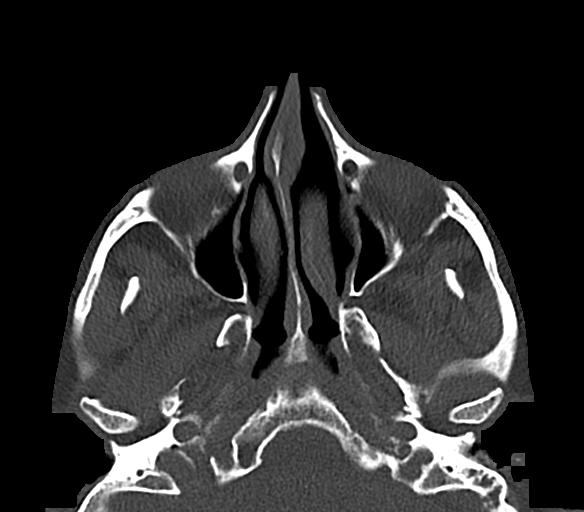
[im 19/33  bone]
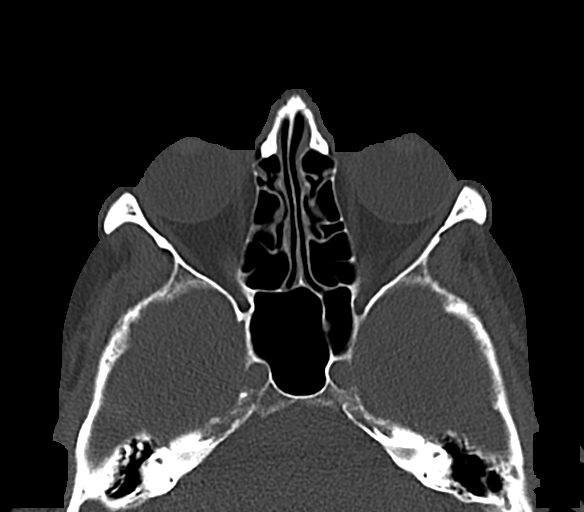
[im 23/33  brain]
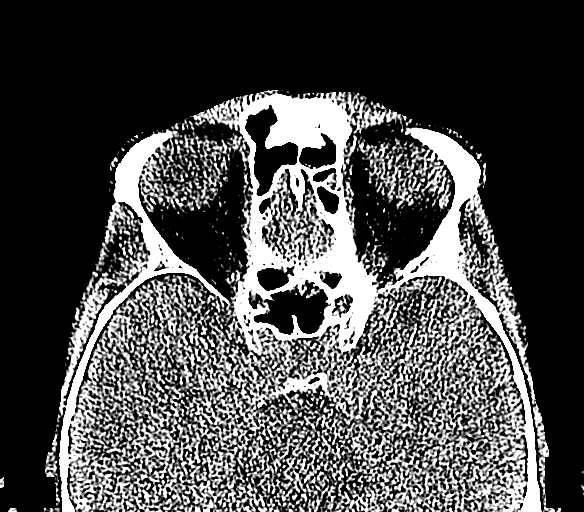
[im 23/33  bone]
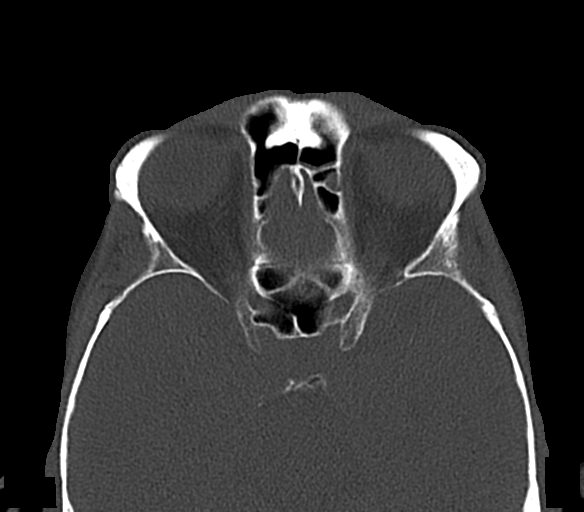
[im 28/33  bone]
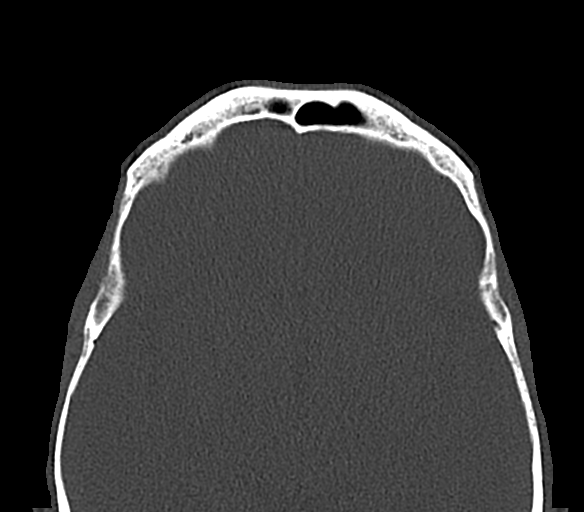

[Series 503: sinus cor 3.00 hr60 s3 · coronal · 0.20mm/px · 3 of 47 slices shown]
[im 16/47  bone]
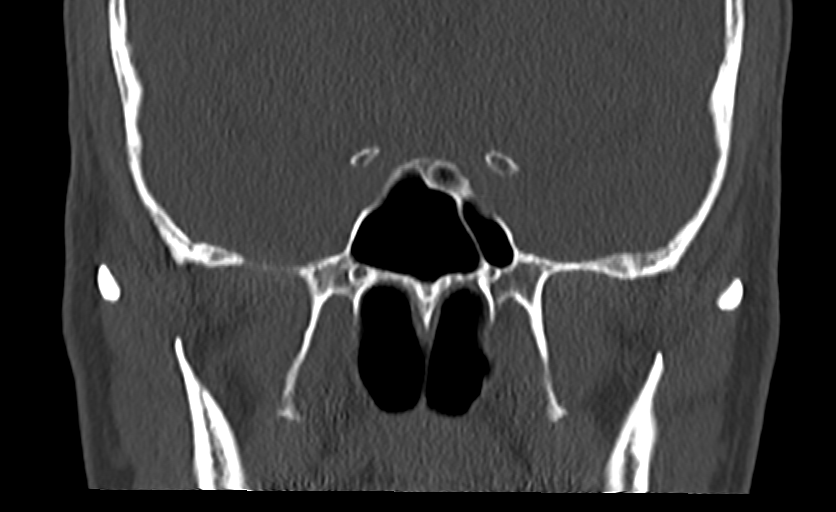
[im 21/47  bone]
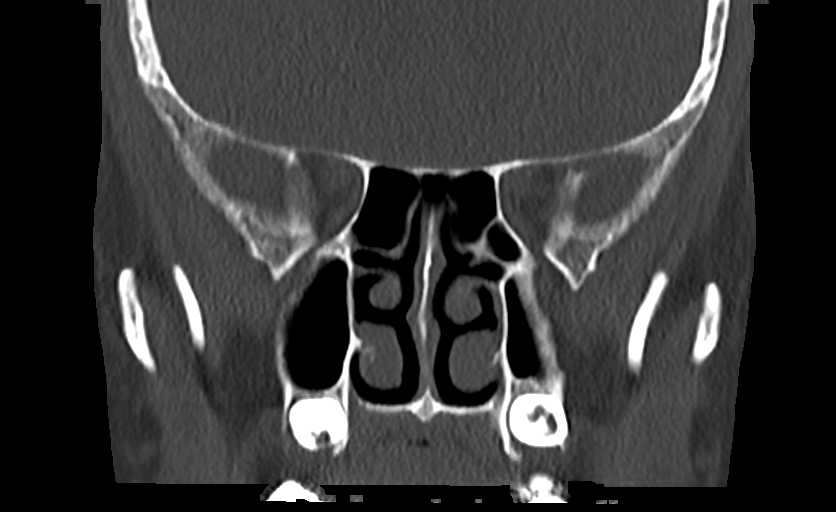
[im 26/47  bone]
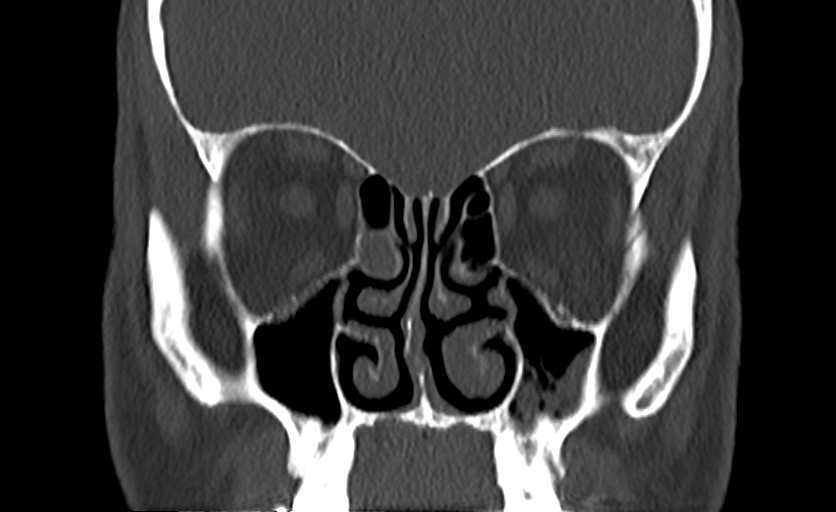

[Series 504: sinus sag 3.00 hr60 s3 · sagittal · 0.20mm/px · 3 of 54 slices shown]
[im 18/54  bone]
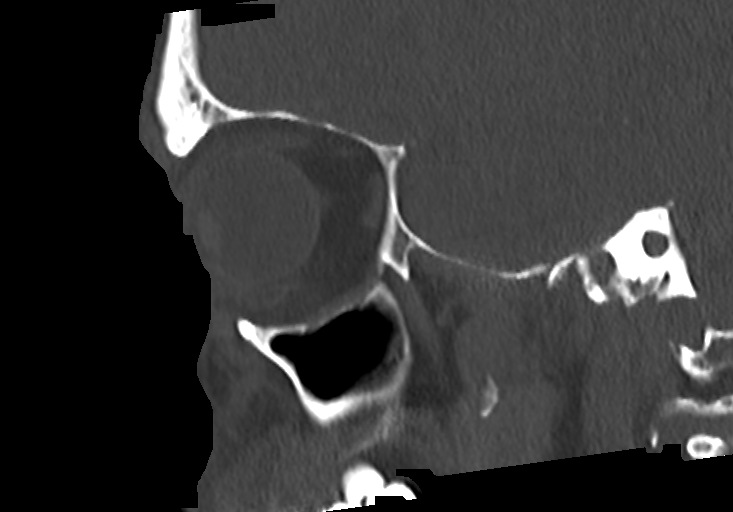
[im 27/54  bone]
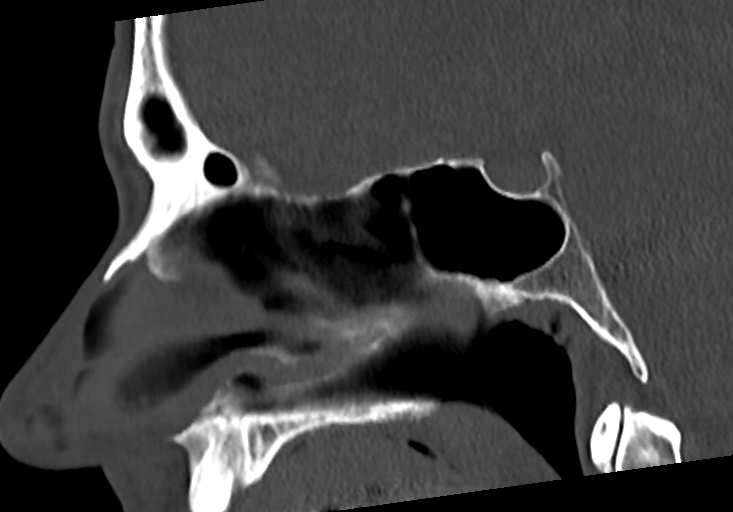
[im 36/54  bone]
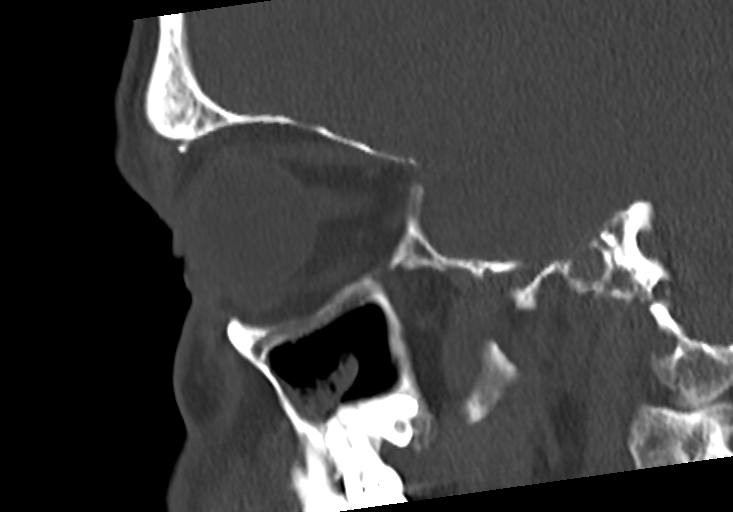

[12 of 47 positions shown; findings below may reference images not displayed]

EXAM

Head CT. Sinus CT.

INDICATION

chronic sinusitis
SEVERE HEAD PAIN, MOSTLY RT SIDED AND POSTERIOR. SINUS PRESSURE. INCREASED SLEEPINESS. H/O OF
SINUSITUS. CT/NM 0/0  HB

TECHNIQUE

Noncontrast CT scan of the head with coronal and sagittal reformations. Noncontrast CT scan of the
paranasal sinuses was then performed with coronal and sagittal reformations also provided. No prior
CT scans or myocardial perfusion scans in the past year. All CT scans at this facility use dose
modulation, iterative reconstruction, and/or weight based dosing when appropriate to reduce
radiation dose to as low as reasonably achievable.

COMPARISONS

Head CT April 13, 2019

FINDINGS

Head CT:

There is no intra-axial or extra-axial hemorrhage. The lateral ventricles and other CSF spaces are
symmetric but mildly prominent in size suggesting volume loss without compelling evidence of sulcal
effacement. No midline shift. No mass effect. Frothy fluid is present in the left maxillary sinus.
There is debris in the external auditory canals, more pronounced on the left. Gray-white
differentiation is preserved. The midline anatomy is grossly unremarkable.

Sinus CT:

Frothy fluid/mucous secretion is present in the left anterior inferior maxillary sinus without
layering posterior fluid. There is no right maxillary sinus disease. The ostiomeatal units are
patent. There is opacification of a right posterior ethmoid air cell but the remaining ethmoid air
cells are clear. There is apex right anterior nasal septal deviation with slight narrowing of the
right anterior nasal septal passage. There is debris in the left external auditory canal. The
tympanic membrane is thin bilaterally. No evidence of mass or fluid in Prussak's space. The internal
auditory canals, cochlear, and semicircular canals are unremarkable. The orbits are symmetric. No
postseptal or retrobulbar edema or hemorrhage. The sphenoid sinuses are clear. The sphenoethmoidal
recesses are patent. The frontal sinuses are likewise mostly clear, though there is slight mucosal
thickening of the left inferior frontal sinus without obstruction of the left frontoethmoidal
recess.

IMPRESSION

Head CT: No acute intracranial pathology.

Sinus CT: Mild paranasal sinus disease without evidence of obstruction.

Tech Notes:

SEVERE HEAD PAIN, MOSTLY RT SIDED AND POSTERIOR. SINUS PRESSURE. INCREASED SLEEPINESS. H/O OF
SINUSITUS. CT/NM 0/0
HB

## 2020-12-25 IMAGING — CT HEADWO
2 of 4 series · 12 of 47 positions shown, 15 images · non-contrast
Comparison: none

[Series 4: brain cor 5.00 hr40 s3 · coronal · 0.31mm/px · 3 of 40 slices shown]
[im 14/40  brain]
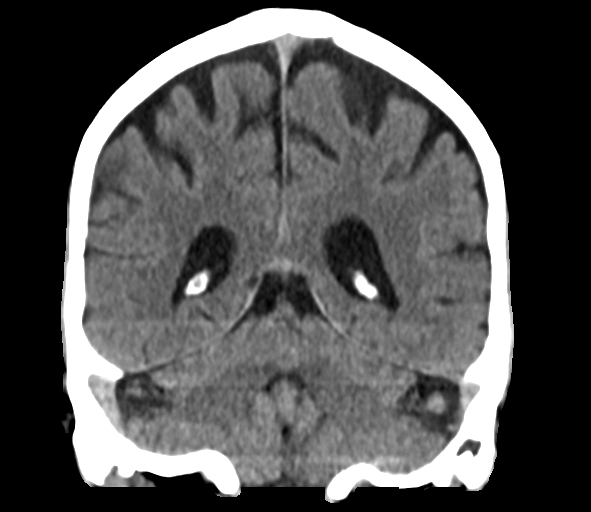
[im 18/40  brain]
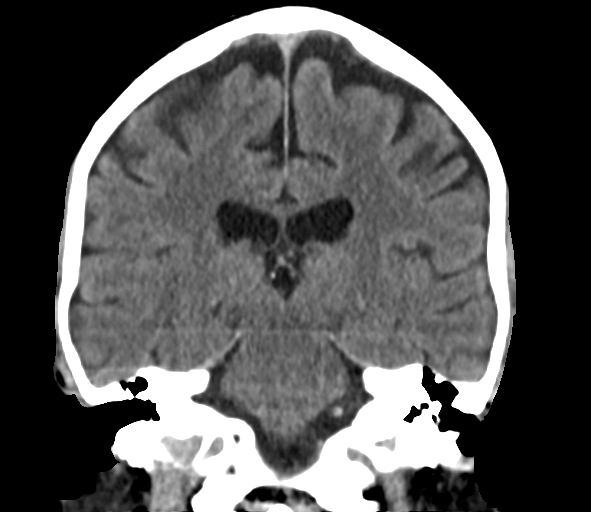
[im 22/40  brain]
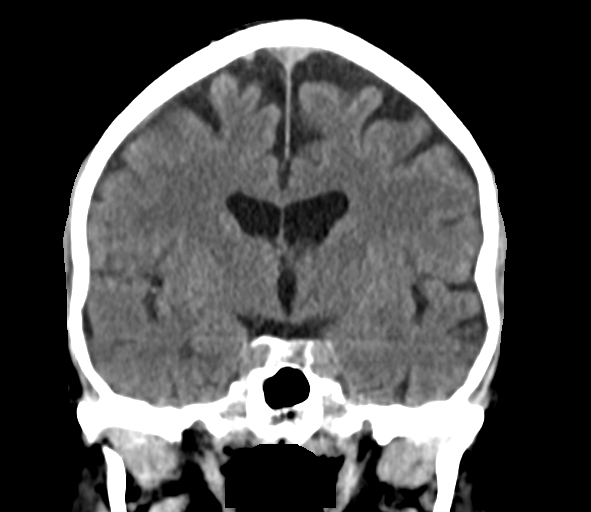

[Series 8: brain ax 2.00 hr60 s3 · axial · 0.32mm/px · z∈[-555,-432]mm · 9 of 78 slices shown, 12 images]
[im 8/78  brain]
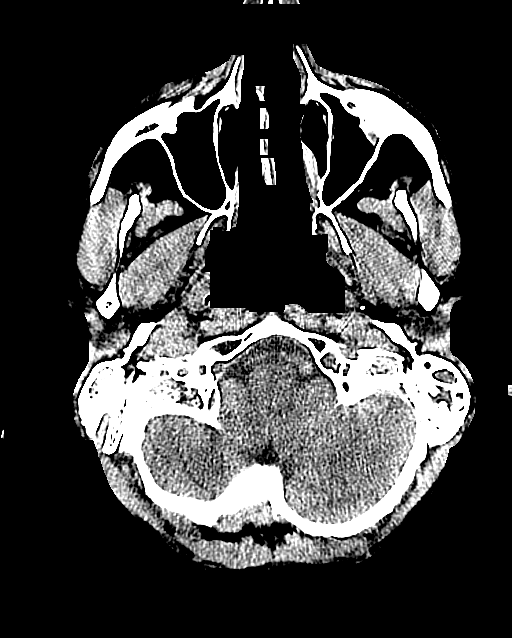
[im 8/78  bone]
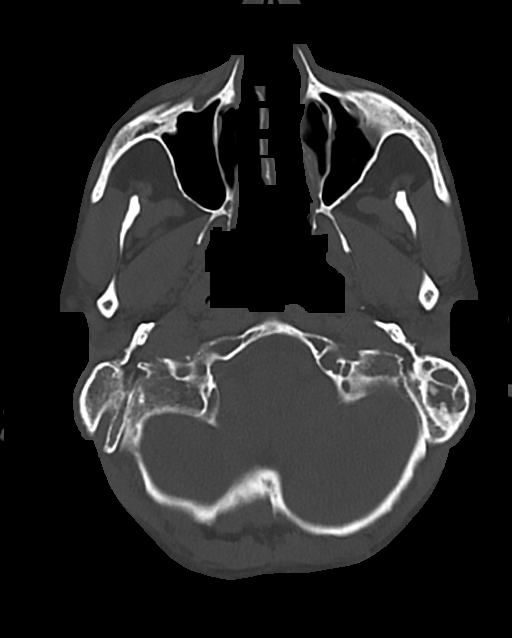
[im 16/78  brain]
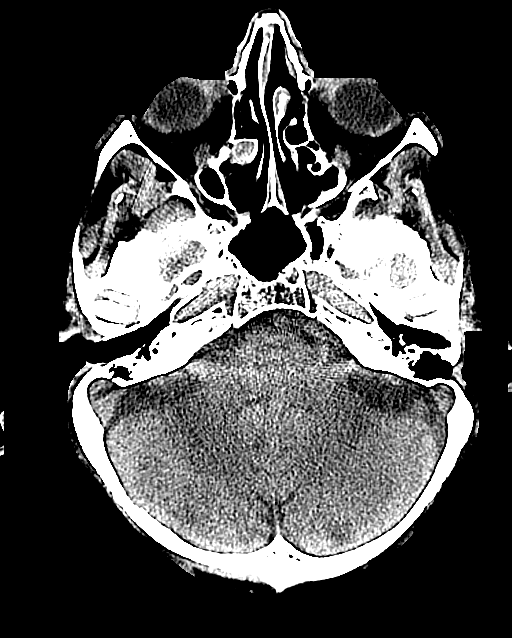
[im 24/78  brain]
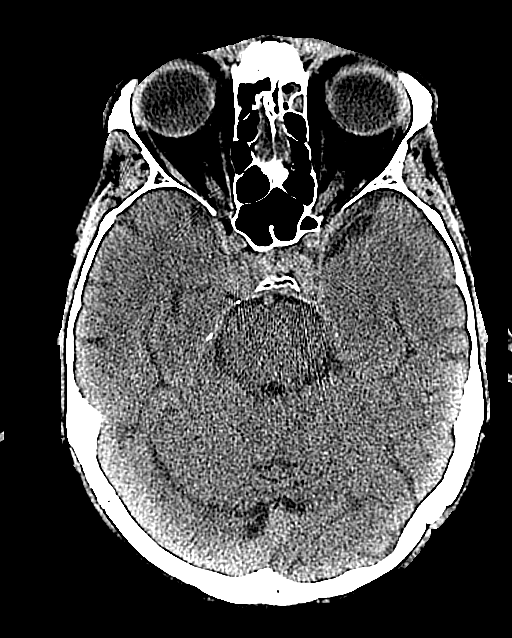
[im 31/78  brain]
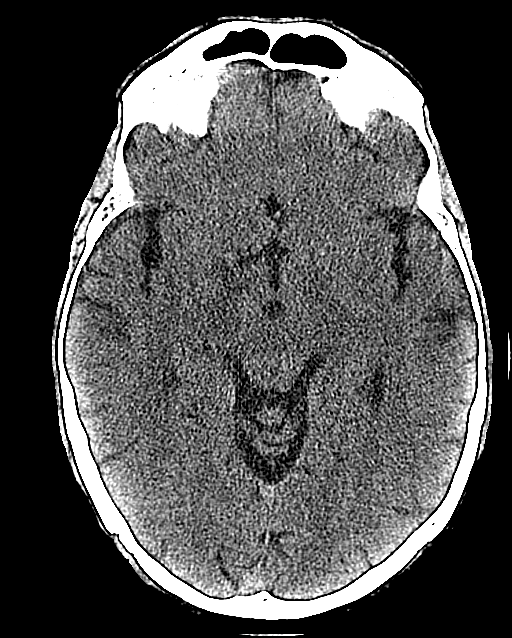
[im 39/78  brain]
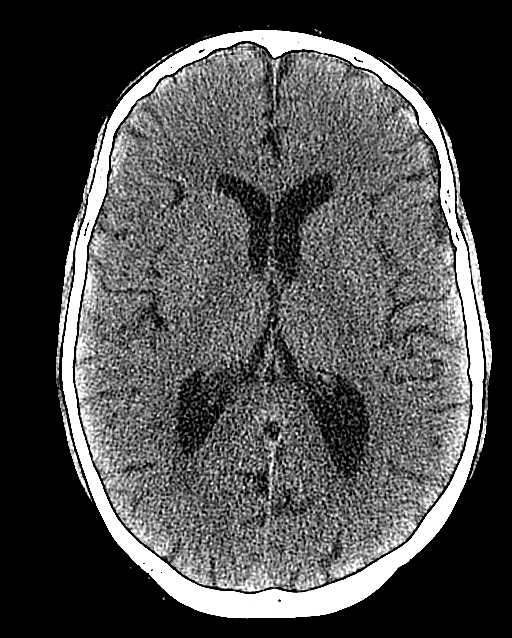
[im 39/78  bone]
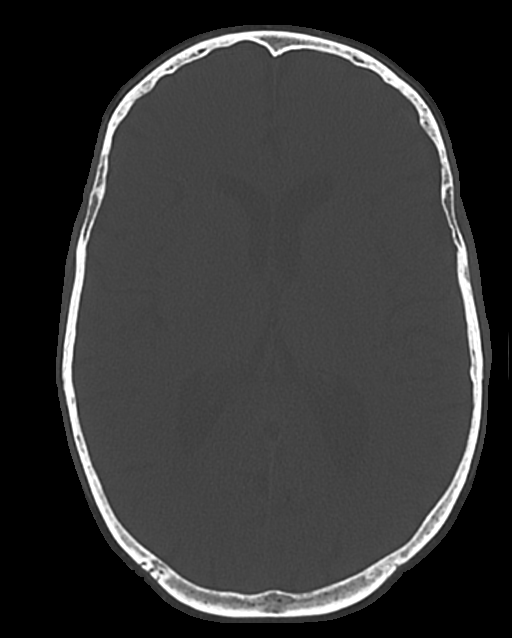
[im 47/78  brain]
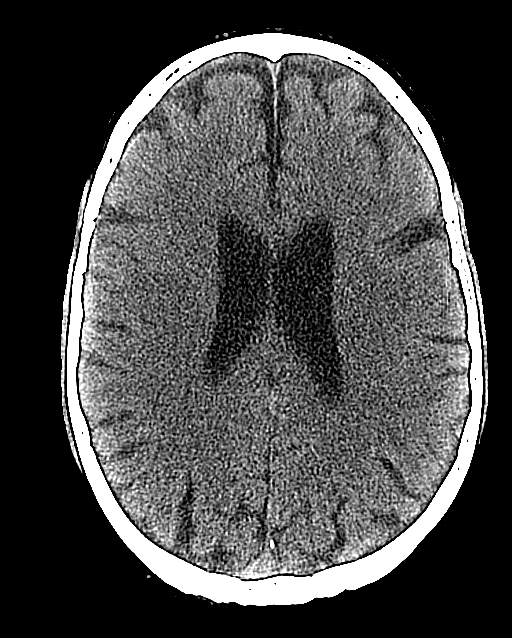
[im 54/78  brain]
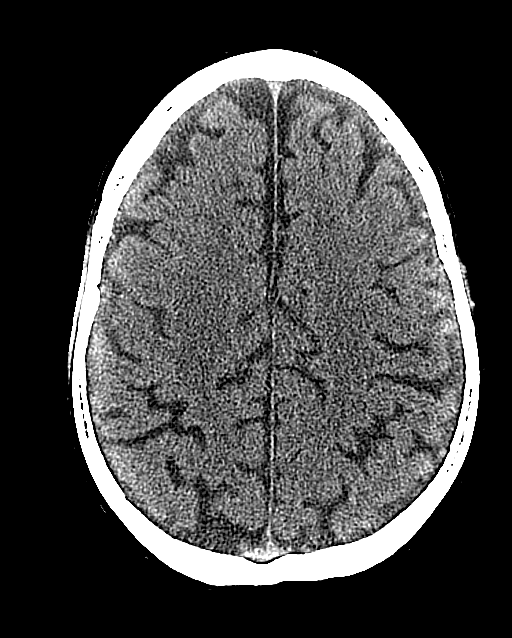
[im 62/78  brain]
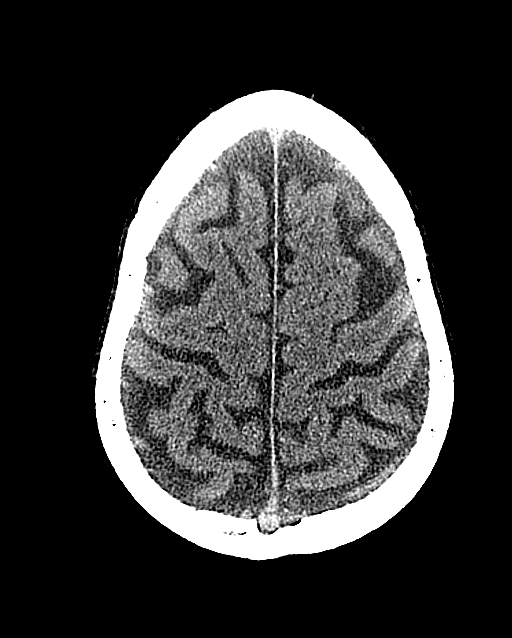
[im 70/78  brain]
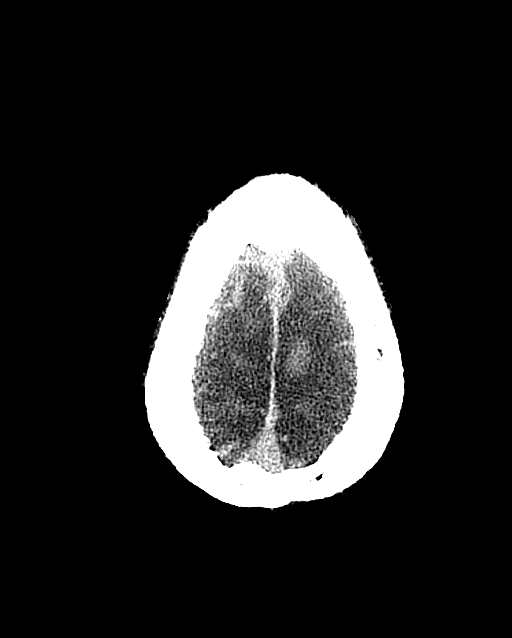
[im 70/78  bone]
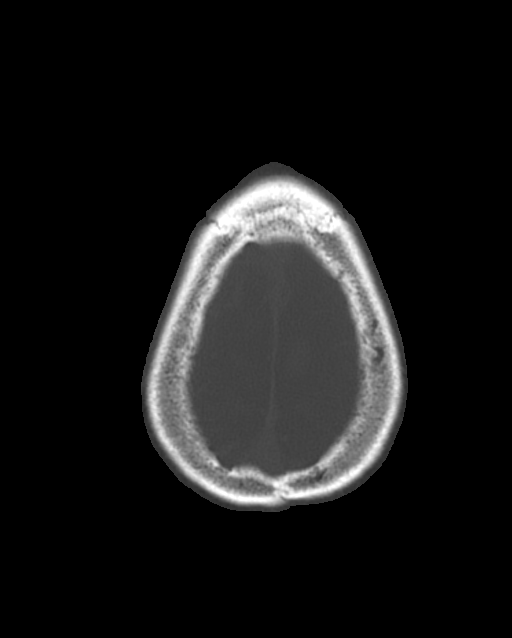

[12 of 47 positions shown; findings below may reference images not displayed]

EXAM

Head CT. Sinus CT.

INDICATION

chronic sinusitis
SEVERE HEAD PAIN, MOSTLY RT SIDED AND POSTERIOR. SINUS PRESSURE. INCREASED SLEEPINESS. H/O OF
SINUSITUS. CT/NM 0/0  HB

TECHNIQUE

Noncontrast CT scan of the head with coronal and sagittal reformations. Noncontrast CT scan of the
paranasal sinuses was then performed with coronal and sagittal reformations also provided. No prior
CT scans or myocardial perfusion scans in the past year. All CT scans at this facility use dose
modulation, iterative reconstruction, and/or weight based dosing when appropriate to reduce
radiation dose to as low as reasonably achievable.

COMPARISONS

Head CT April 13, 2019

FINDINGS

Head CT:

There is no intra-axial or extra-axial hemorrhage. The lateral ventricles and other CSF spaces are
symmetric but mildly prominent in size suggesting volume loss without compelling evidence of sulcal
effacement. No midline shift. No mass effect. Frothy fluid is present in the left maxillary sinus.
There is debris in the external auditory canals, more pronounced on the left. Gray-white
differentiation is preserved. The midline anatomy is grossly unremarkable.

Sinus CT:

Frothy fluid/mucous secretion is present in the left anterior inferior maxillary sinus without
layering posterior fluid. There is no right maxillary sinus disease. The ostiomeatal units are
patent. There is opacification of a right posterior ethmoid air cell but the remaining ethmoid air
cells are clear. There is apex right anterior nasal septal deviation with slight narrowing of the
right anterior nasal septal passage. There is debris in the left external auditory canal. The
tympanic membrane is thin bilaterally. No evidence of mass or fluid in Prussak's space. The internal
auditory canals, cochlear, and semicircular canals are unremarkable. The orbits are symmetric. No
postseptal or retrobulbar edema or hemorrhage. The sphenoid sinuses are clear. The sphenoethmoidal
recesses are patent. The frontal sinuses are likewise mostly clear, though there is slight mucosal
thickening of the left inferior frontal sinus without obstruction of the left frontoethmoidal
recess.

IMPRESSION

Head CT: No acute intracranial pathology.

Sinus CT: Mild paranasal sinus disease without evidence of obstruction.

Tech Notes:

SEVERE HEAD PAIN, MOSTLY RT SIDED AND POSTERIOR. SINUS PRESSURE. INCREASED SLEEPINESS. H/O OF
SINUSITUS. CT/NM 0/0
HB

## 2021-02-08 IMAGING — MR C-spine^Routine
5 series · 43 of 48 positions shown · non-contrast
Comparison: none

[Series 3: T2 · sagittal · 3.0mm · 0.49mm/px · 6 of 15 slices shown]
[im 1/15]
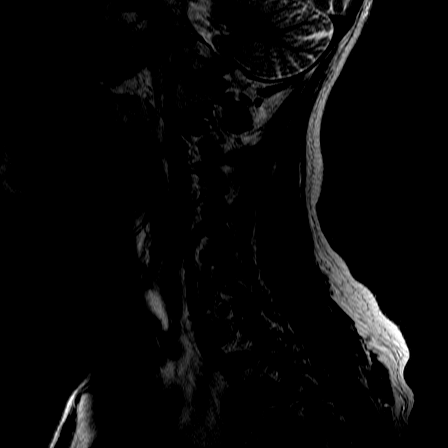
[im 3/15]
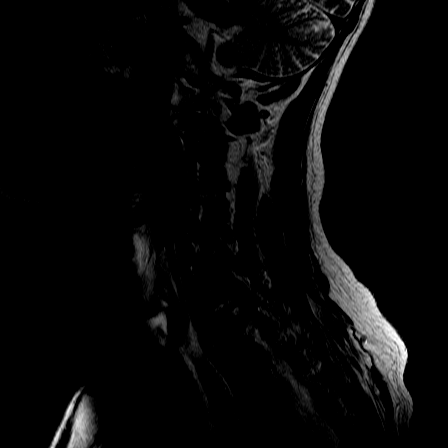
[im 6/15]
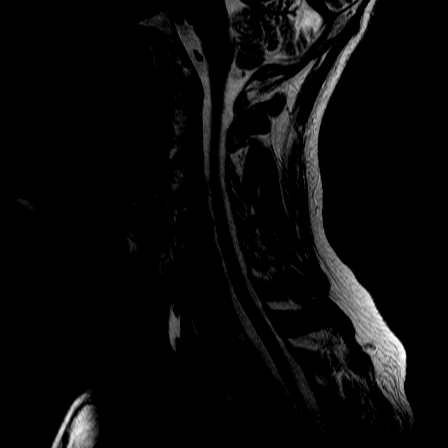
[im 9/15]
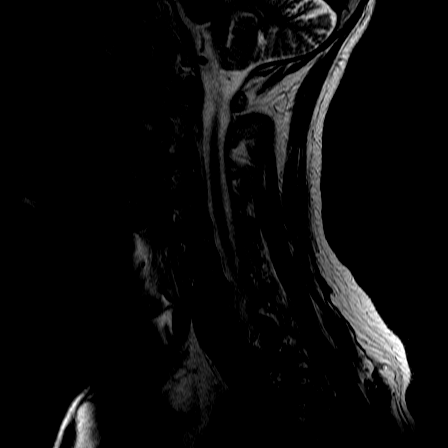
[im 12/15]
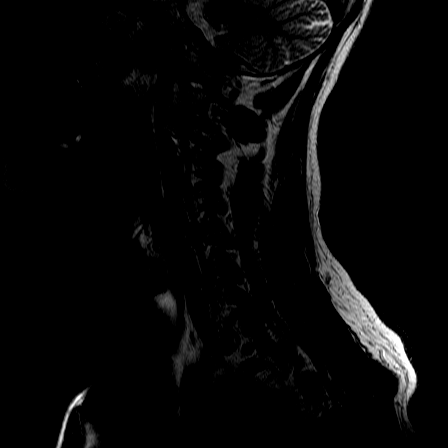
[im 15/15]
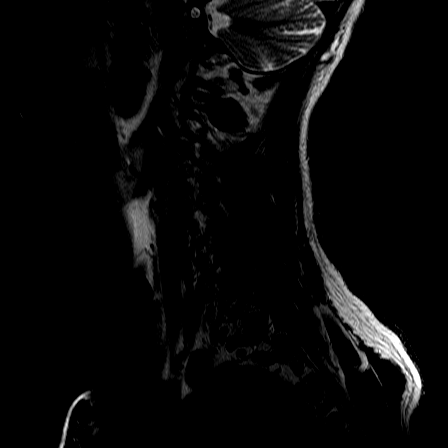

[Series 4: T1 · sagittal · 3.0mm · 0.69mm/px · 7 of 15 slices shown (1 of 2)]
[im 1/15]
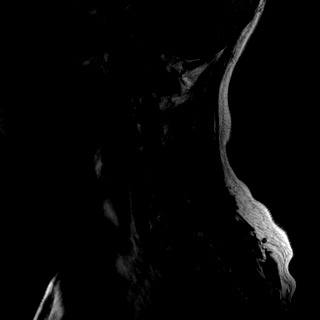
[im 3/15]
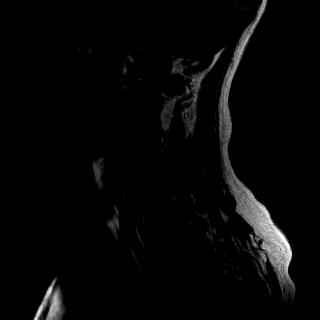
[im 5/15]
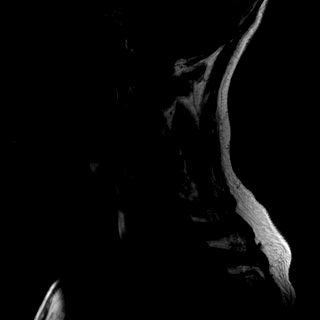
[im 8/15]
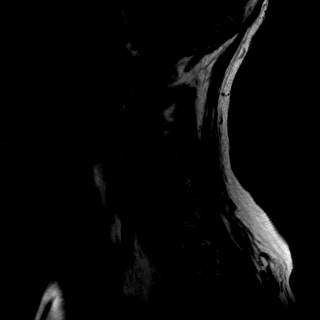
[im 10/15]
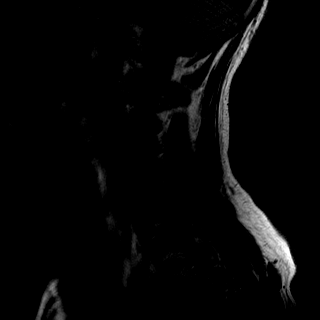
[im 12/15]
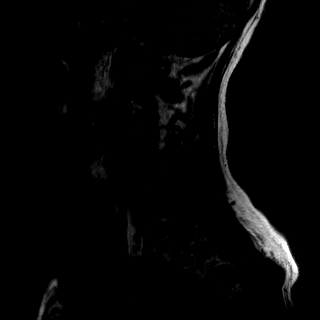
[im 15/15]
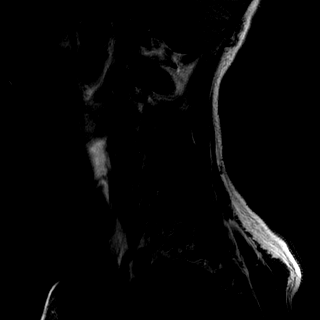

[Series 5: STIR · sagittal · 3.0mm · 0.86mm/px · 7 of 15 slices shown]
[im 1/15]
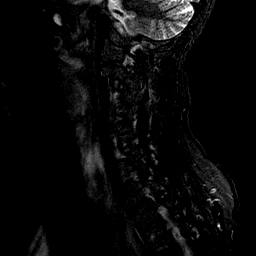
[im 3/15]
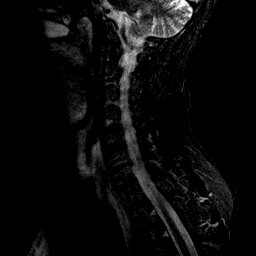
[im 5/15]
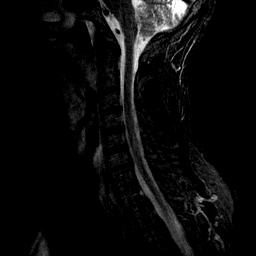
[im 8/15]
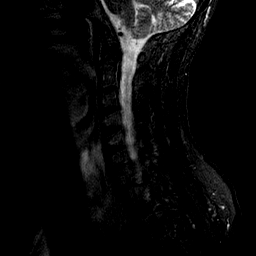
[im 10/15]
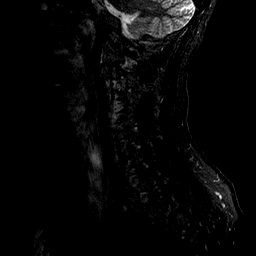
[im 12/15]
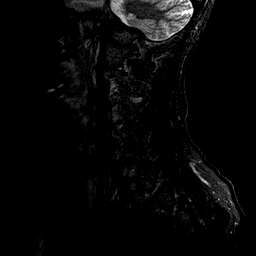
[im 15/15]
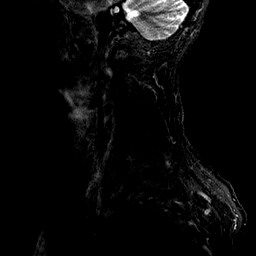

[Series 8: provider echo axial · axial · 3.0mm · 0.39mm/px · z∈[-49,+61]mm · 9 of 29 slices shown]
[im 1/29]
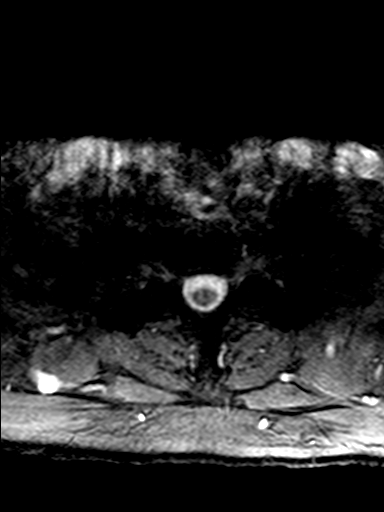
[im 3/29]
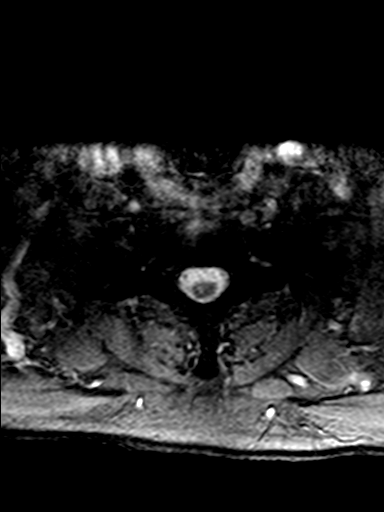
[im 5/29]
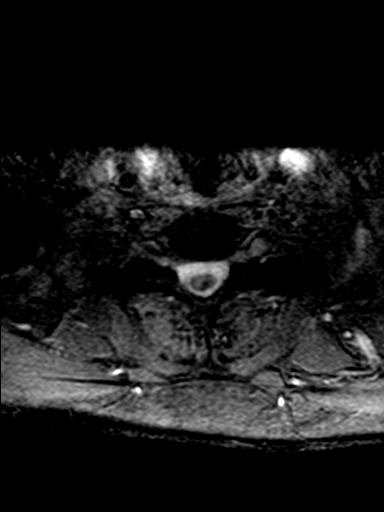
[im 9/29]
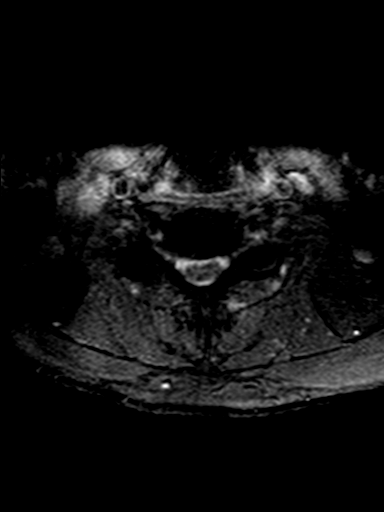
[im 13/29]
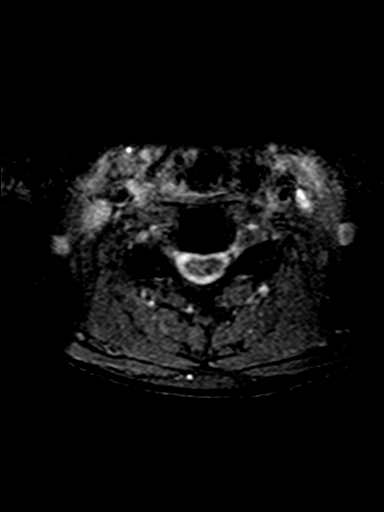
[im 16/29]
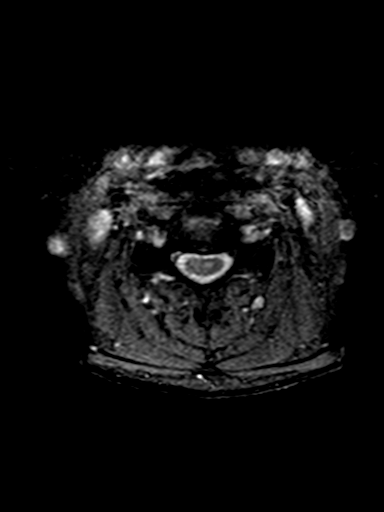
[im 20/29]
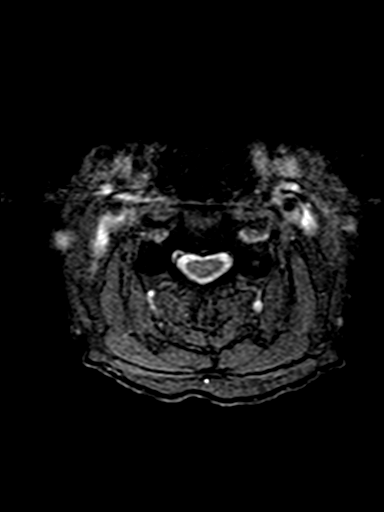
[im 24/29]
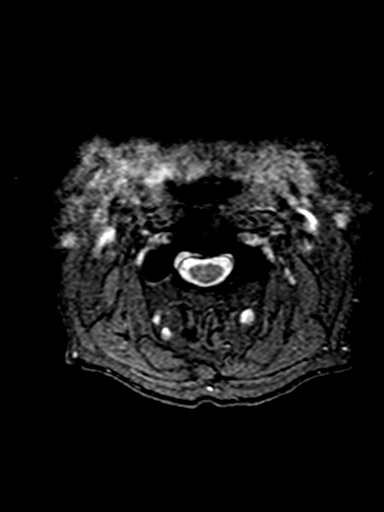
[im 29/29]
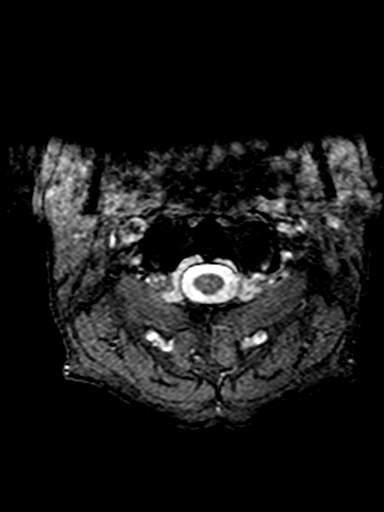

[Series 10: T1 · axial · 3.0mm · 0.78mm/px · z∈[-49,+61]mm · 14 of 29 slices shown (2 of 2)]
[im 1/29]
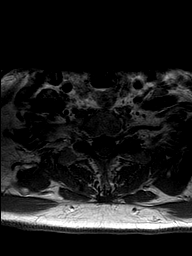
[im 3/29]
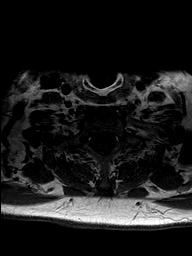
[im 5/29]
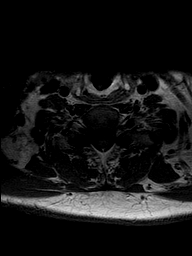
[im 7/29]
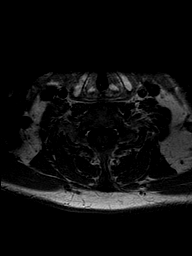
[im 9/29]
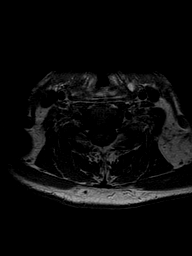
[im 11/29]
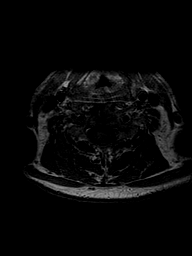
[im 13/29]
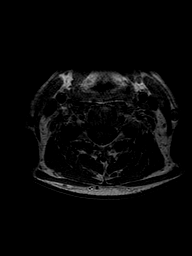
[im 16/29]
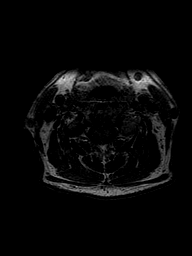
[im 18/29]
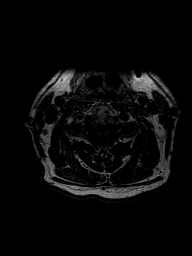
[im 20/29]
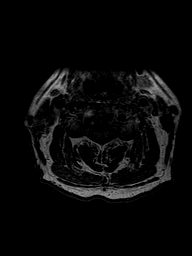
[im 22/29]
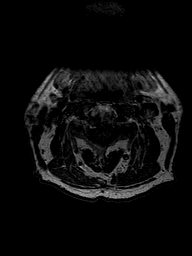
[im 24/29]
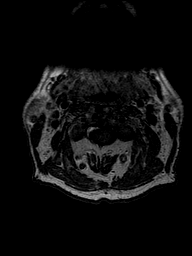
[im 26/29]
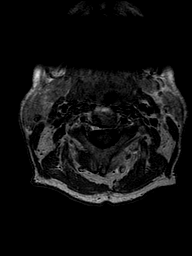
[im 29/29]
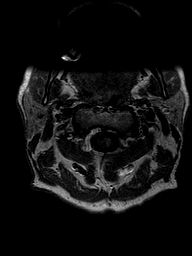

[43 of 48 positions shown; findings below may reference images not displayed]

DIAGNOSTIC STUDIES

EXAM

MRI of the cervical spine without contrast.

INDICATION

Neck pain
neck pain with constant right ear ringing, radiculopathy into shoulders, repeated images due to
motion

TECHNIQUE

Sagittal and axial images were obtained with variable T1 and T2 weighting.

COMPARISONS

None available

FINDINGS

There are no abnormal signal properties of the visualized brainstem, cervical cord, or visualized
thoracic spinal cord. The intervertebral discs at C2-3, C3-4, and C4-5 are normal without central
canal or neural foraminal stenosis.

C5-6: There is mild disc osteophyte complex at this level without central canal or neural foraminal
stenosis.

C6-7: Disc osteophyte complex is also noted at this level resulting in minimal bilateral neural
foraminal stenosis

C7-T1: The disc is normal at this level.

IMPRESSION

Disc osteophyte complex at C5-6 and C6-7. There is minimal neural foraminal stenosis bilaterally at

Tech Notes:

neck pain with constant right ear ringing, radiculopathy into shoulders, repeated images due to
motion

## 2021-09-27 ENCOUNTER — Encounter: Admit: 2021-09-27 | Discharge: 2021-09-27 | Payer: Commercial Managed Care - HMO

## 2021-11-30 ENCOUNTER — Encounter: Admit: 2021-11-30 | Discharge: 2021-11-30 | Payer: Commercial Managed Care - HMO

## 2021-11-30 DIAGNOSIS — K219 Gastro-esophageal reflux disease without esophagitis: Secondary | ICD-10-CM

## 2021-11-30 DIAGNOSIS — I10 Essential (primary) hypertension: Secondary | ICD-10-CM

## 2021-11-30 DIAGNOSIS — G4733 Obstructive sleep apnea (adult) (pediatric): Secondary | ICD-10-CM

## 2021-11-30 DIAGNOSIS — Z8249 Family history of ischemic heart disease and other diseases of the circulatory system: Secondary | ICD-10-CM

## 2021-11-30 DIAGNOSIS — F419 Anxiety disorder, unspecified: Secondary | ICD-10-CM

## 2021-11-30 DIAGNOSIS — G629 Polyneuropathy, unspecified: Secondary | ICD-10-CM

## 2021-11-30 NOTE — Telephone Encounter
Attempt(s) to Contact:  - 11/30/21: Spoke to pt, PVP complete     Initial Appointment: 12/28/21  Reason for Visit: Cervicalgia, polyneuropathy, weakness    Physician Information  Primary Care/Referring Physician:    - Name: Dr. Steva Ready   - Location: Fax [phone]: [facility]  Previous Neurologist:     - Name: Dr. Magnus Ivan    - Location: [facility]   Other relevant providers:    - Name: None   - Location: N/A    Any Previous Imaging/Procedures of Head/Neck/Spine  MRI/MRA:   MRI [facility] 02/08/21  Disc osteophyte complex at C5-6 and C6-7. There is minimal neural foraminal  stenosis bilaterally at C6-7.  CT/CTA:  CT [facility] 12/25/20  No acute intracranial pathology.  CT Sinus [facility] 12/25/20  Mild paranasal sinus disease without evidence of obstruction.  X-RAY: N/A  EMG: BUE/BLE [facility]   EEG: EEG [facility]  SLEEP STUDY: N/A  NEUROPSYCH TESTING:N/A  LUMBAR PUNCTURE:N/A  LABS:N/A    Any Hospitalizations or ER visits within the last 3 years   Hospitalizations: None  Emergency Department Visits: None    Requested Records  - MRI for cloud from [facility]   - CTs for cloud from [facility]  - EEG from [place]  - EMG from [facility]   - Dr. Magnus Ivan records     Medications, Drug Allergies, Medical/Surgical/Social History Updated on Chart w/ Patient

## 2021-12-02 ENCOUNTER — Encounter: Admit: 2021-12-02 | Discharge: 2021-12-02 | Payer: Commercial Managed Care - HMO

## 2021-12-10 IMAGING — MR L-spine^Routine
5 series · 34 of 48 positions shown · non-contrast
Comparison: none

[Series 2: T2 · sagittal · 4.0mm · 0.62mm/px · 7 of 13 slices shown (1 of 2)]
[im 1/13]
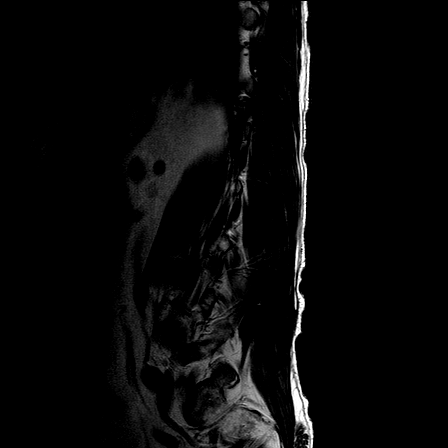
[im 3/13]
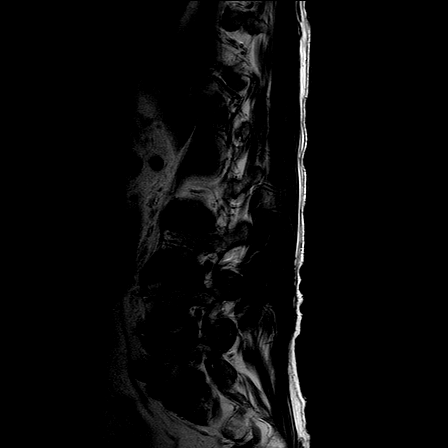
[im 5/13]
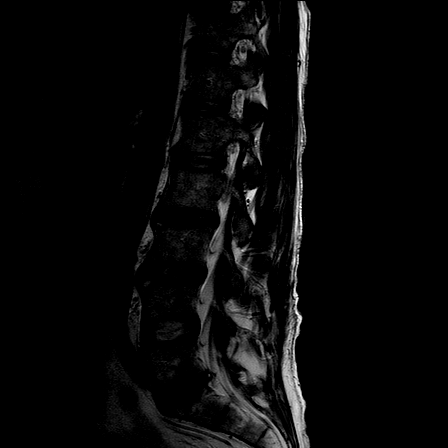
[im 7/13]
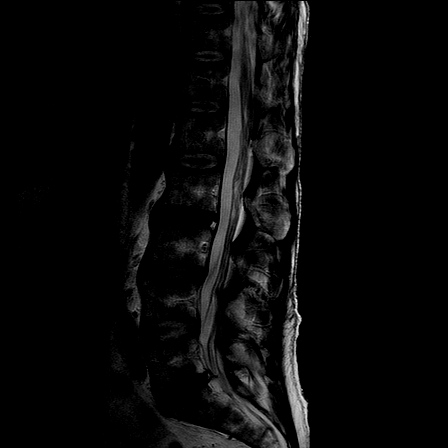
[im 9/13]
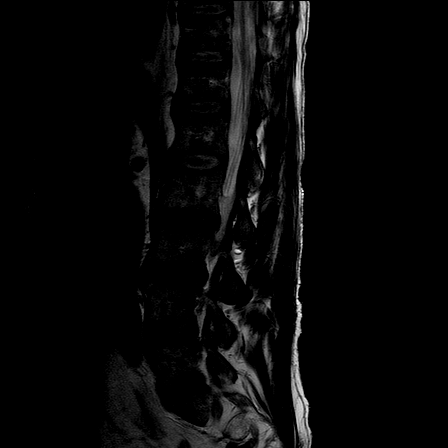
[im 11/13]
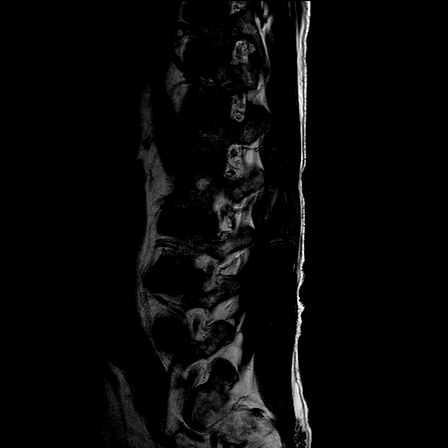
[im 13/13]
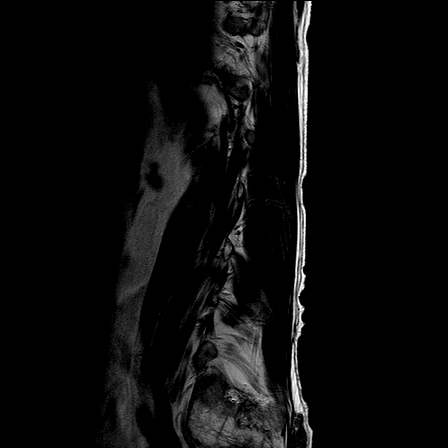

[Series 3: T1 · sagittal · 4.0mm · 0.73mm/px · 7 of 13 slices shown (1 of 2)]
[im 1/13]
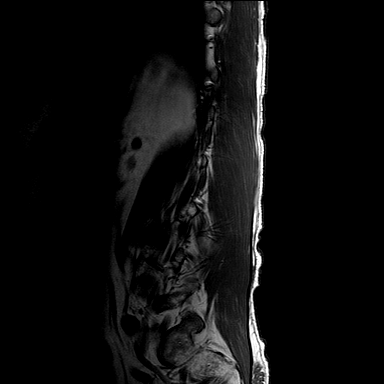
[im 3/13]
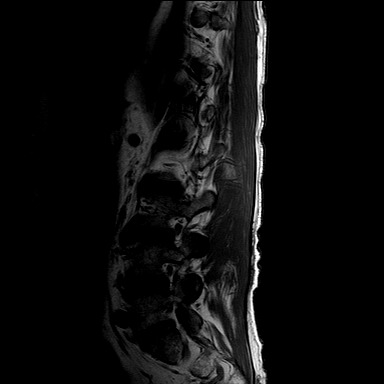
[im 5/13]
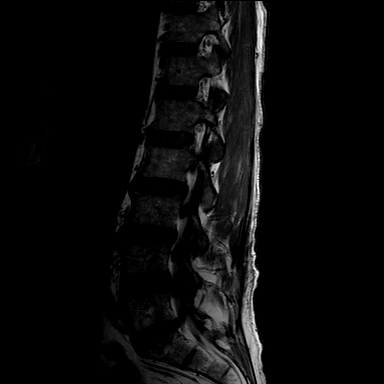
[im 7/13]
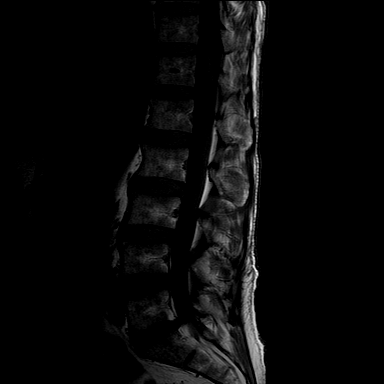
[im 9/13]
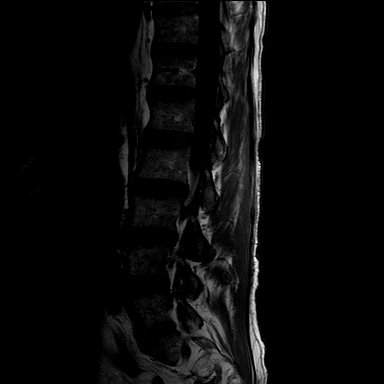
[im 11/13]
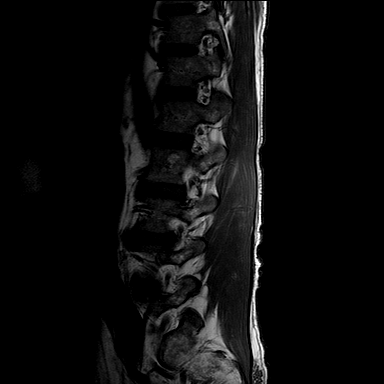
[im 13/13]
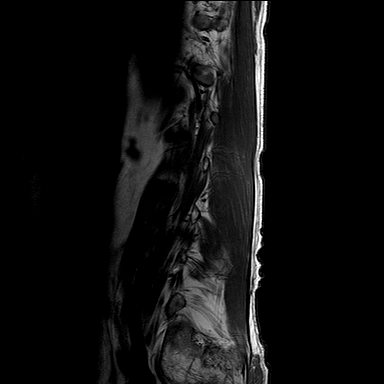

[Series 4: STIR · sagittal · 4.0mm · 0.55mm/px · 4 of 13 slices shown]
[im 1/13]
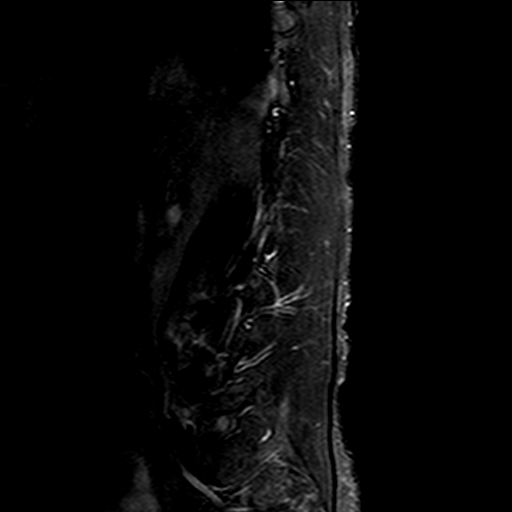
[im 3/13]
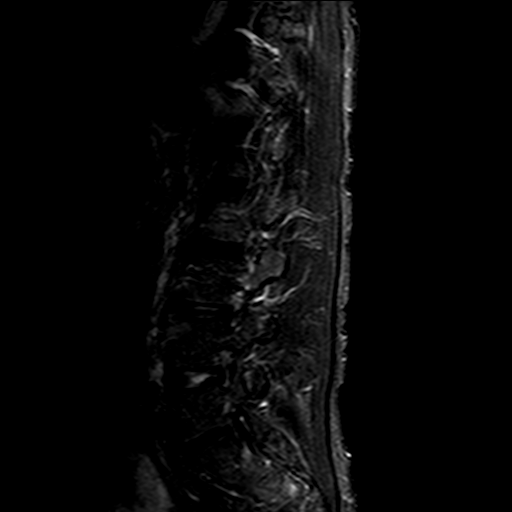
[im 5/13]
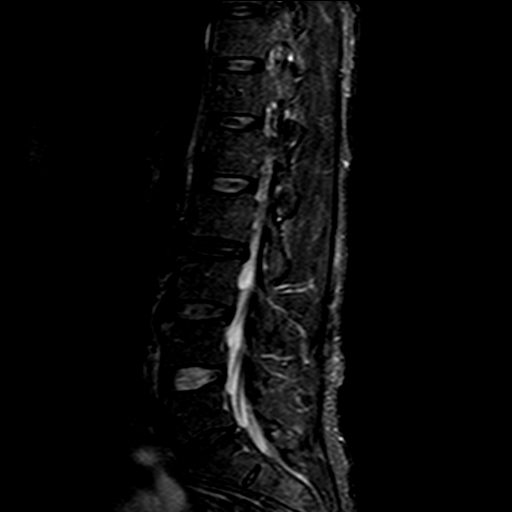
[im 8/13]
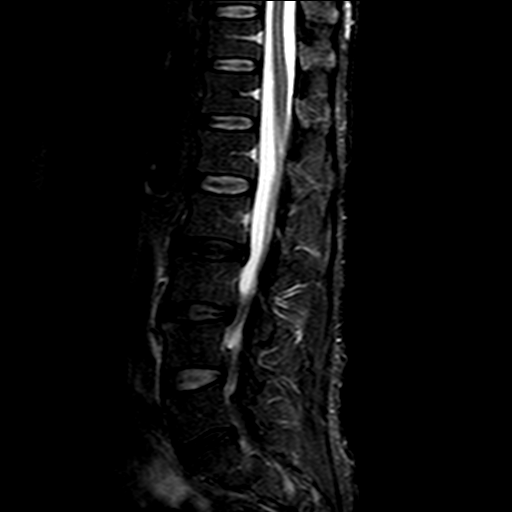

[Series 5: T2 · axial · 4.5mm · 0.49mm/px · z∈[-182,+7]mm · 8 of 29 slices shown (2 of 2)]
[im 1/29]
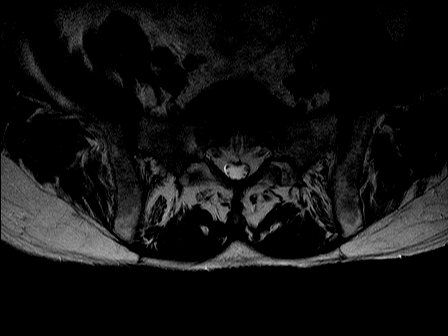
[im 5/29]
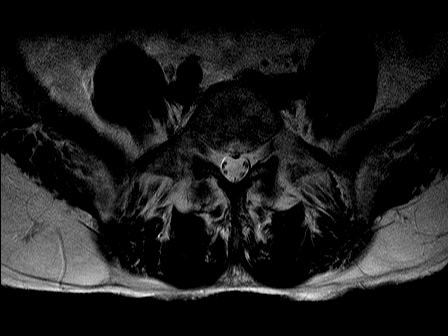
[im 9/29]
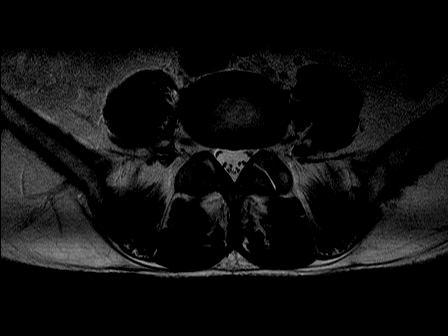
[im 13/29]
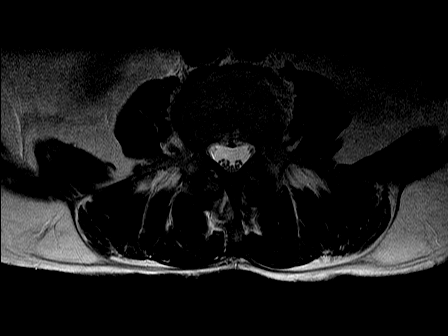
[im 16/29]
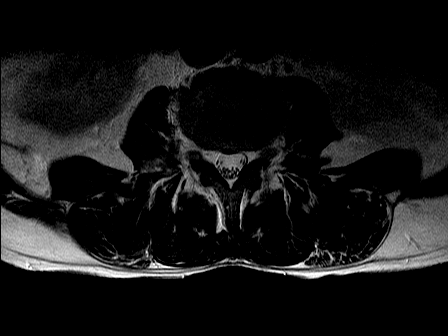
[im 20/29]
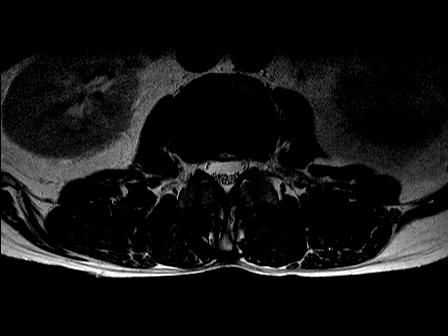
[im 24/29]
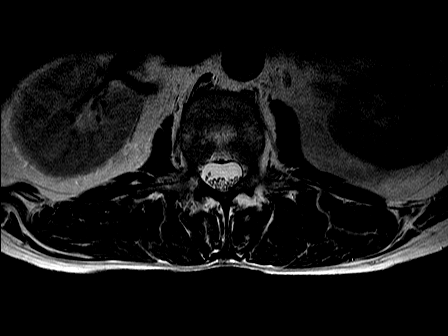
[im 29/29]
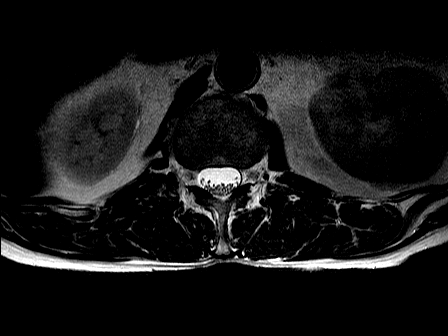

[Series 6: T1 · axial · 4.5mm · 0.86mm/px · z∈[-182,+7]mm · 8 of 29 slices shown (2 of 2)]
[im 1/29]
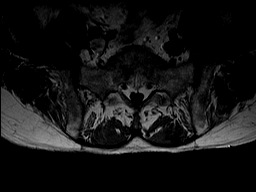
[im 5/29]
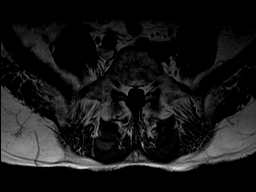
[im 9/29]
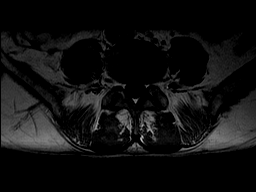
[im 13/29]
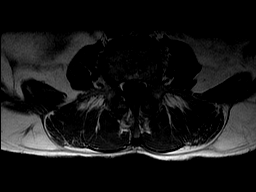
[im 16/29]
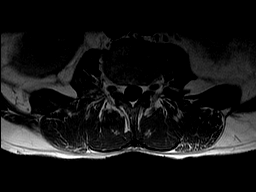
[im 20/29]
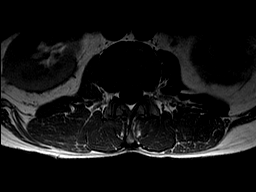
[im 24/29]
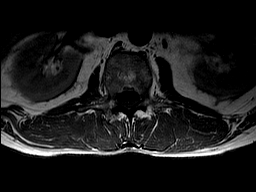
[im 29/29]
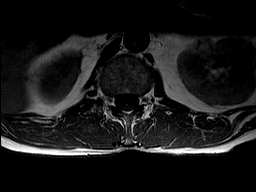

[34 of 48 positions shown; findings below may reference images not displayed]

DIAGNOSTIC STUDIES

EXAM

MAGNETIC RESONANCE IMAGING, SPINAL CANAL AND CONTENTS, LUMBAR; WITHOUT CONTRAST MATERIAL CPT 16892

INDICATION

Back pain.

TECHNIQUE

Routine imaging sequences were obtained on a GE system.

COMPARISONS

CT 12/07/2021.

FINDINGS

Minimal convexity to the right. The marrow signal is slightly heterogeneous but is likely within
normal limits. There are multiple small hemangiomas. No significant marrow edema. No acute displaced
fracture or dislocation. The conus is located at the level of L1.

L1-L2: No significant disc bulge or protrusion.

L2-L3: Mild diffuse disc bulge with mild ligamentum flava hypertrophy and facet arthropathy. Mild
central canal stenosis. No neural foramen stenosis.

L3-L4: Mild diffuse disc bulge with mild ligamentum flava hypertrophy and facet arthropathy. Mild
central canal stenosis. Mild neural foramen stenosis on the right.

L4-L5: Mild diffuse disc bulge with mild ligamentum flava hypertrophy and facet arthropathy. Mild
central canal stenosis. No neural foramen stenosis.

L5-S1: Mild diffuse disc bulge with mild ligamentum flava hypertrophy and facet arthropathy. Mild
central canal stenosis. Mild neural foramen stenosis on the left.

No enlarged retroperitoneal nodes. No paraspinal fluid collections. Paravertebral soft tissues are
not remarkable.

IMPRESSION

Mild degenerative changes.

Tech Notes:

LOW BACK PAIN WITH RT SIDED PAIN, BILATERAL NEUROPATHY.  RG

## 2021-12-28 ENCOUNTER — Encounter: Admit: 2021-12-28 | Discharge: 2021-12-28 | Payer: Private Health Insurance - Indemnity

## 2021-12-28 ENCOUNTER — Ambulatory Visit: Admit: 2021-12-28 | Discharge: 2021-12-28 | Payer: Private Health Insurance - Indemnity

## 2021-12-28 VITALS — BP 125/81 | HR 91 | Ht 71.0 in | Wt 163.6 lb

## 2021-12-28 DIAGNOSIS — M542 Cervicalgia: Secondary | ICD-10-CM

## 2021-12-28 DIAGNOSIS — K5792 Diverticulitis of intestine, part unspecified, without perforation or abscess without bleeding: Secondary | ICD-10-CM

## 2021-12-28 DIAGNOSIS — F419 Anxiety disorder, unspecified: Secondary | ICD-10-CM

## 2021-12-28 DIAGNOSIS — Z8249 Family history of ischemic heart disease and other diseases of the circulatory system: Secondary | ICD-10-CM

## 2021-12-28 DIAGNOSIS — G629 Polyneuropathy, unspecified: Secondary | ICD-10-CM

## 2021-12-28 DIAGNOSIS — R208 Other disturbances of skin sensation: Principal | ICD-10-CM

## 2021-12-28 DIAGNOSIS — R531 Weakness: Secondary | ICD-10-CM

## 2021-12-28 DIAGNOSIS — R200 Anesthesia of skin: Secondary | ICD-10-CM

## 2021-12-28 DIAGNOSIS — R202 Paresthesia of skin: Secondary | ICD-10-CM

## 2021-12-28 DIAGNOSIS — G4733 Obstructive sleep apnea (adult) (pediatric): Secondary | ICD-10-CM

## 2021-12-28 DIAGNOSIS — I10 Essential (primary) hypertension: Secondary | ICD-10-CM

## 2021-12-28 DIAGNOSIS — K219 Gastro-esophageal reflux disease without esophagitis: Secondary | ICD-10-CM

## 2021-12-28 MED ORDER — GABAPENTIN 300 MG PO CAP
300 mg | ORAL_CAPSULE | Freq: Three times a day (TID) | ORAL | 5 refills | Status: AC
Start: 2021-12-28 — End: ?

## 2021-12-28 NOTE — Patient Instructions
Jonathan Crosby 56 y.o. male with history of anxiety, HTN, OSA on CPAP and imbalance who presented to neurology for evaluation of neuropathy and headache.     1. Decreased peripheral vibratory sense    2. Bilateral numbness and tingling of arms and legs      RECOMMENDATIONS  - Increase gabapentin to 300mg  TID for occipital neuralgia.   - Advised adequate hydration to avoid dehydration related headache.   - Neuropathic evaluation as listed below.  - EMG/NCS was done on summer 2022 and will request for records.        Orders Placed This Encounter    EMG PROCEDURE    VITAMIN B12    METHYLMALONIC ACID QUANT    HOMOCYSTEINE-PLASMA    COPPER    SED RATE    IMMUNOFIXATION, SERUM (IFES)    HEMOGLOBIN A1C    gabapentin (NEURONTIN) 300 mg capsule       FOLLOWUP PLAN  Return in about 6 months (around 06/27/2022).  The patient is instructed to contact me if there are any concerns with the agreed plan.  Problem   Bilateral Numbness and Tingling of Arms and Legs

## 2021-12-28 NOTE — Progress Notes
Date of Service: 12/28/2021    Referral Physician: Dr. Steva Ready    Subjective:             Jonathan Crosby is a 56 y.o. male with polyneuropathy and head pain.     History of Present Illness  Jonathan Crosby 56 y.o. male with history of anxiety, HTN, OSA on CPAP and imbalance who presented to neurology for evaluation of neuropathy and headache.     His symptoms started a year ago. He has noticed dizziness and pain on the right posterior head in 09/2020. 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 He has history of alcohol use.     He continues to have dull ache behind the right posterior head region and is constant in nature. He had been taking ibuprofen for a while which helped some and has also been treated with shots in the past which helped some. He has associated phonophobia and occasional nausea but denies photophobia. He had been taking gabapentin 200mg  three times daily which has been helping. He does hear high pitch noise specially when he lays down to go to sleep.     6-7 month ago he started losing feeling on the feet and calf region. He had to be careful when walking and driving car. He has been having imbalance as well. Last fall was 6 months. He used to drink alcohol and quit drinking 6-7 months ago. 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     Review of Records:   Patient was referred to me from Dr. Steva Ready for evaluation of weakness and neuropathy.     Medical History:   Diagnosis Date   ? Acid reflux 07/18/2019   ? Anxiety 07/18/2019   ? Cervicalgia    ? Diverticulitis    ? Family history of premature CAD 07/18/2019   ? Hypertension 07/18/2019   ? Neuropathy    ? OSA on CPAP 07/18/2019   ? Weakness          Surgical History:   Procedure Laterality Date   ? EAR SURGERY     ? HIP REPLACEMENT Bilateral          Social History     Socioeconomic History   ? Marital status: Single   Tobacco Use   ? Smoking status: Every Day     Packs/day: 1.50     Types: Cigarettes   ? Smokeless tobacco: Never   Substance and Sexual Activity   ? Alcohol use: Not Currently     Comment: Bourbon and Beer Daily   ? Drug use: Never         Family History   Problem Relation Age of Onset   ? Heart Attack Father 61   ? Heart Disease Father    ? Hypertension Father    ? Heart Attack Paternal Grandfather 35   ? Heart Disease Paternal Grandfather    ? Hypertension Paternal Grandfather    ? Irritable Bowel Disease Mother        No Known Allergies         Review of Systems      Objective:         ? gabapentin (NEURONTIN) 300 mg capsule Take one capsule by mouth three times daily.   ? lisinopriL (ZESTRIL) 40 mg tablet Take 40 mg by mouth daily.   ?  LORazepam (ATIVAN) 2 mg tablet Take 1 tablet by mouth three times daily as needed for Nausea.   ? NIFEdipine XL (PROCARDIA-XL) 30 mg tablet Take 1 tablet by mouth daily.   ? pantoprazole DR (PROTONIX) 40 mg tablet Take 1 tablet by mouth twice daily.   ? tadalafiL (CIALIS) 20 mg tablet Take 20 mg by mouth as Needed for Erectile dysfunction.   ? testosterone (ANDROGEL PUMP) 1.62 % (20.25 mg/1.25 g) transdermal gel Daily   ? thiamine HCL (VITAMIN B-1) 100 mg tablet Take 100 mg by mouth twice daily.   ? traMADoL (ULTRAM) 50 mg tablet Take 50-100 mg by mouth every 6-8 hours as needed for Pain.     Vitals:    12/28/21 1354   BP: 125/81   BP Source: Arm, Left Upper   Pulse: 91   SpO2: 97%   PainSc: One   Weight: 74.2 kg (163 lb 9.6 oz)   Height: 180.3 cm (5' 11)     Body mass index is 22.82 kg/m?Marland Kitchen     Physical Exam  General: no acute distress, cooperative    HEENT: normocephalic, atraumatic, fundi benign   CV: well perfused  Pulm: equal chest rise, non labored breathing  Ext: no swelling, cyanosis and clubbing     Neurological Exam   Mental status: Alert and oriented to time, place, person and situation.  Speech: Fluent, with normal naming, comprehension, articulation and repetition  CN II-XII: Visual fields intact to confrontation, PERRL (4->2), EOMI, facial sensation intact. Symmetrical facial movement. Hearing grossly intact. Strong cough, elevates palate, uvula midline. Strong shoulder shrug. Tongue midline.  Motor: (R/L) b/l UE/LE 5/5 in proximal and distal muscle groups. Normal tone and bulk. No abnormal movement, fasciculation or pronator drift.  Sensory: Intact light touch. Pin prick is reduced below shin. Vibration is 4 second in big toes  Reflexes: (R/L) 2/2Bj, 2/2Trj, 2/2Brj, 2/2Knj, 2/2Aj.   Coordination/ fine movement: Normal finger to nose, heel to shin.  Gait: Normal stance and steady gait. Romberg sign absent.       Assessment and Plan:  Jonathan Crosby 56 y.o. male with history of anxiety, HTN, OSA on CPAP and imbalance who presented to neurology for evaluation of neuropathy and headache.     1. Decreased peripheral vibratory sense    2. Bilateral numbness and tingling of arms and legs      RECOMMENDATIONS  - Increase gabapentin to 300mg  TID for occipital neuralgia.   - Advised adequate hydration to avoid dehydration related headache.   - Neuropathic evaluation as listed below.  - EMG/NCS was done on summer 2022 and will request for records.        Orders Placed This Encounter   ? EMG PROCEDURE   ? VITAMIN B12   ? METHYLMALONIC ACID QUANT   ? HOMOCYSTEINE-PLASMA   ? COPPER   ? SED RATE   ? IMMUNOFIXATION, SERUM (IFES)   ? HEMOGLOBIN A1C   ? gabapentin (NEURONTIN) 300 mg capsule       FOLLOWUP PLAN  Return in about 6 months (around 06/27/2022).  The patient is instructed to contact me if there are any concerns with the agreed plan.  Problem   Bilateral Numbness and Tingling of Arms and Legs        Total time 60 minutes.  Estimated counseling time was more than 50% of the visit time. Counseled patient regarding neuropathy.       Linden Dolin, MD  Clinical Assistant Professor   [employer] [place]  [employer]

## 2022-01-03 ENCOUNTER — Encounter: Admit: 2022-01-03 | Discharge: 2022-01-03 | Payer: Private Health Insurance - Indemnity

## 2022-01-05 ENCOUNTER — Encounter: Admit: 2022-01-05 | Discharge: 2022-01-05 | Payer: Private Health Insurance - Indemnity

## 2022-01-07 ENCOUNTER — Encounter: Admit: 2022-01-07 | Discharge: 2022-01-07 | Payer: Private Health Insurance - Indemnity

## 2022-01-07 DIAGNOSIS — R208 Other disturbances of skin sensation: Secondary | ICD-10-CM

## 2022-01-07 LAB — IMMUNOFIXATION, SERUM (IFES)

## 2022-01-07 LAB — VITAMIN B12: VITAMIN B12: 226 pg/mL (ref 200–1100)

## 2022-01-07 LAB — SED RATE: ESR: 4 mm/h (ref 0–20)

## 2022-01-07 LAB — METHYLMALONIC ACID QUANT: METHYLMALONIC ACID: 109 nmol/L (ref 87–318)

## 2022-01-07 LAB — COPPER: COPPER: 118 ug/dL (ref 70–175)

## 2022-01-07 LAB — HOMOCYSTEINE: HOMOCYSTEINE: 15 umol/L — ABNORMAL HIGH (ref <11.4)

## 2022-01-28 ENCOUNTER — Encounter: Admit: 2022-01-28 | Discharge: 2022-01-28 | Payer: Private Health Insurance - Indemnity

## 2022-01-28 ENCOUNTER — Ambulatory Visit: Admit: 2022-01-28 | Discharge: 2022-01-28 | Payer: Private Health Insurance - Indemnity

## 2022-01-28 VITALS — BP 128/86 | HR 86 | Ht 70.984 in

## 2022-01-28 DIAGNOSIS — R200 Anesthesia of skin: Secondary | ICD-10-CM

## 2022-01-28 DIAGNOSIS — G4733 Obstructive sleep apnea (adult) (pediatric): Secondary | ICD-10-CM

## 2022-01-28 DIAGNOSIS — Z8249 Family history of ischemic heart disease and other diseases of the circulatory system: Secondary | ICD-10-CM

## 2022-01-28 DIAGNOSIS — R531 Weakness: Secondary | ICD-10-CM

## 2022-01-28 DIAGNOSIS — I10 Essential (primary) hypertension: Secondary | ICD-10-CM

## 2022-01-28 DIAGNOSIS — K219 Gastro-esophageal reflux disease without esophagitis: Secondary | ICD-10-CM

## 2022-01-28 DIAGNOSIS — K5792 Diverticulitis of intestine, part unspecified, without perforation or abscess without bleeding: Secondary | ICD-10-CM

## 2022-01-28 DIAGNOSIS — R208 Other disturbances of skin sensation: Secondary | ICD-10-CM

## 2022-01-28 DIAGNOSIS — G629 Polyneuropathy, unspecified: Secondary | ICD-10-CM

## 2022-01-28 DIAGNOSIS — E538 Deficiency of other specified B group vitamins: Secondary | ICD-10-CM

## 2022-01-28 DIAGNOSIS — M542 Cervicalgia: Secondary | ICD-10-CM

## 2022-01-28 DIAGNOSIS — R202 Paresthesia of skin: Secondary | ICD-10-CM

## 2022-01-28 DIAGNOSIS — F419 Anxiety disorder, unspecified: Secondary | ICD-10-CM

## 2022-01-28 MED ORDER — PHYSICIANS EZ USE B-12 1,000 MCG/ML IJ KIT
ORAL | 0 refills | 30.00000 days | Status: AC
Start: 2022-01-28 — End: ?

## 2022-01-28 MED ORDER — CYANOCOBALAMIN (VITAMIN B-12) 1,000 MCG/ML IJ SOLN
1000 ug | Freq: Once | INTRAMUSCULAR | 0 refills | Status: CP
Start: 2022-01-28 — End: ?

## 2022-01-28 NOTE — Progress Notes
I counseled pt that the purpose & goal of B12 IM injections is to replace low vitamin B12 levels.  Possible risks & complications discussed including, but not limited to bleeding, bruising, pain, allergic rxn, mild diarrhea, dizziness, and headache.  Pt demonstrated an understanding & wished to proceed.  Needle safety reviewed.  I gave him careful verbal & visual instructions of how to perform self injection, specifically inject into the muscle.  I taught him how to draw up medication in 1 mL syringe.    I injected into LF deltoid.  Printed instructions were provided to pt for home reference.  He demonstrated a good understanding of how to perform self injection.

## 2022-01-28 NOTE — Progress Notes
General Neurology Division  [address]  [place], [place] [address]  [phone]        Name: Jonathan Crosby Gender: Male  Patient ID: [id number] Date of Birth: Nov 23, 1966      Visit Date: 01/28/2022 9:16 AM  Age: 56 Years  Examining Physician: Linden Dolin, MD  Referring Physician: Linden Dolin, MD  Height: 5 feet 11 inch  Patient History: Mrs. Claydon is a 56 year old male here for right mononeuropathy screening. Please review my clinic note for further detail.       Summary    The motor conduction test was performed on 2 nerve(s). The results were normal in 1 nerve(s): R Ulnar - ADM. Results outside the specified normal range were found in 1 nerve(s), as follows:  ? In the R Median - APB study  o the take off velocity result was reduced for Elbow - Wrist segment    The sensory conduction test had results outside of the specified normal range in all 4 of the tested nerves:  ? In the R Median - Antidromic study  o the peak latency result was increased for Wrist stimulation  o the peak amplitude result was reduced for Wrist stimulation  o the peak latency result was increased for Wrist stimulation  o the peak amplitude result was reduced for Wrist stimulation  o the take off velocity result was reduced for Wrist - Dig II segment  o the take off velocity result was reduced for Wrist - Dig II segment  ? In the R Ulnar - Antidromic study  o the peak latency result was increased for Wrist stimulation  o the peak amplitude result was reduced for Wrist stimulation  o the peak latency result was increased for Wrist stimulation  o the peak amplitude result was reduced for Wrist stimulation  o the take off velocity result was reduced for Wrist - Dig V segment  o the take off velocity result was reduced for Wrist - Dig V segment  ? In the R Radial - Anatomical Snuffbox study  o the peak amplitude result was reduced for Forearm stimulation  o the peak amplitude result was reduced for Forearm stimulation  ? In the R Median, Ulnar - Palmar study  o the peak latency result was increased for Median Mid-palm stimulation  o the peak amplitude result was reduced for Median Mid-palm stimulation  o the peak amplitude result was reduced for Ulnar Mid-palm stimulation    The F wave study was unremarkable in all 2 of the tested nerves: R Median - APB, R Ulnar - ADM    The needle EMG examination was performed in 5 muscles. It was normal in 4 muscle(s): R. First dorsal interosseous, R. Pronator teres, R. Biceps brachii, R. Deltoid. The study was abnormal in 1 muscle(s), with the following distribution:  ? The R. Triceps brachii had Volitional MUAPs.      _________________________________________________________________________________________      Impression:     1. 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   2. Chronic right C7 radiculopathy, non-active.   3. 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 Amp. Dur. Segments Dist. Lat Diff CV Temp.    ms mV ms  mm ms m/s ?C  R Median - APB      Wrist 4.1 14.8 6.0 Wrist - APB 70 4.1  31.5      Elbow 9.5 14.4 6.3 Elbow - Wrist 257 5.4 48 31.5   R Ulnar - ADM      Wrist 2.9 11.3 7.7 Wrist - ADM 70 2.9  30.8      B. Elb 7.1 10.7 7.5 B. Elb - Wrist 225 4.3 53 30.7      A. Elb 8.9 10.4 7.7 A. Elb - B. Elb 100 1.8 56 29.3           F Wave      Nerve M Lat F lat    ms ms   R Median - APB 4.11 33.28   R Ulnar - ADM 2.92 31.09           SNC      Nerve / Sites Onset Lat Peak Lat Amp. Segments Lat Diff Dist. CV Temp.    ms ms ?V  ms mm m/s ?C   R Median - Antidromic      Wrist 3.0 3.9 10.3 Wrist - Dig II 2.98 130 43.6 32.6      Wrist 3.0 4.0 9.3 Wrist - Dig II 3.00 130 43.3 32.2   R Ulnar - Antidromic      Wrist 2.5 3.2 6.7 Wrist - Dig V 2.50 110 44.0 31.9      Wrist 2.5 3.2 7.1 Wrist - Dig V 2.52 110 43.6 31.7   R Radial - Anatomical Snuffbox      Forearm 1.9 2.5 12.2 Forearm - Snuffbox 1.94 100 51.6 31.5      Forearm 2.0 2.5 8.6 Forearm - Snuffbox 1.98 100 50.5 31.5             SNC - Comparison      Nerve / Sites Onset Lat Peak Lat Amp. Diff. Temp.    ms ms ?V ms ?C   R Median, Ulnar - Palmar      Median Mid-palm 2.4 3.0 9 1.2 31.2      Ulnar Mid-palm 1.3 1.8 7           EMG Summary Table     Insertional Spontaneous Activity Volitional MUAPs     Muscle Activity Fibs PSW Fasc Poly Amp Dur Recruit Other   R. First dorsal interosseous Normal None None None None Normal Normal Normal None   R. Pronator teres Normal None None None None Normal Normal Normal None   R. Biceps brachii Normal None None None None Normal Normal Normal None   R. Triceps brachii Normal None None None + ++ ++ Normal None   R. Deltoid Normal None None None None Normal Normal Normal None           Motor Nerve Distance Dist. Lat. Amplitude Cond. Vel. F-wave   Median  Recording APB 7 cm < 4.5 > 4.5 > 49 < 32   Ulnar  Recording ADM 7 cm < 3.6 > 5 > 50 < 33   Radial  Recording EIP 8 cm < 3.1 > 4 > 50    Peroneal  Recording EDB 8 cm < 6.6 > 2 > 41 < 57   Tibial  Recording AHB 8 cm < 6.0 > 4 > 41 < 57     Sensory Nerve Distance Peak Lat. Amplitude Cond. Vel.   Median Wrist  Antidromic 13 cm < 3.5 (<age 14)  < 3.7 (>age 14) > 15 >  48   Ulnar Wrist  Antidromic 11 cm < 3.1 > 10 (<age 67)  > 5 (>age 67) > 48   Radial 10 cm < 2.8 > 15 (<age 67)  > 10 (>age 67)    Median Palm 8 cm < 2.2 > 20    Ulnar Palm 8 cm < 2.2 > 10    Sural 14 cm < 4.5 > 6 (<age 67)  > 3 (>age 67) > 41            Procedure Time Out Check List:  Prior to the start of the procedure, I personally confirmed the following:    Site Marking Verified: Yes, as appropriate  Patient Identity (name & date of birth): Yes  Procedure: Yes  Site: Yes  Body Part: Right upper extremity    The risks of the procedure, including infection, bleeding, pain and skin changes, were discussed with the patient.

## 2022-04-28 ENCOUNTER — Encounter: Admit: 2022-04-28 | Discharge: 2022-04-28 | Payer: Private Health Insurance - Indemnity

## 2022-06-28 ENCOUNTER — Encounter: Admit: 2022-06-28 | Discharge: 2022-06-28 | Payer: Private Health Insurance - Indemnity

## 2022-06-28 ENCOUNTER — Ambulatory Visit: Admit: 2022-06-28 | Discharge: 2022-06-29 | Payer: Private Health Insurance - Indemnity

## 2022-06-28 DIAGNOSIS — G608 Other hereditary and idiopathic neuropathies: Secondary | ICD-10-CM

## 2022-06-28 DIAGNOSIS — K5792 Diverticulitis of intestine, part unspecified, without perforation or abscess without bleeding: Secondary | ICD-10-CM

## 2022-06-28 DIAGNOSIS — G629 Polyneuropathy, unspecified: Secondary | ICD-10-CM

## 2022-06-28 DIAGNOSIS — Z8249 Family history of ischemic heart disease and other diseases of the circulatory system: Secondary | ICD-10-CM

## 2022-06-28 DIAGNOSIS — M5481 Occipital neuralgia: Secondary | ICD-10-CM

## 2022-06-28 DIAGNOSIS — M542 Cervicalgia: Secondary | ICD-10-CM

## 2022-06-28 DIAGNOSIS — F419 Anxiety disorder, unspecified: Secondary | ICD-10-CM

## 2022-06-28 DIAGNOSIS — R531 Weakness: Secondary | ICD-10-CM

## 2022-06-28 DIAGNOSIS — K219 Gastro-esophageal reflux disease without esophagitis: Secondary | ICD-10-CM

## 2022-06-28 DIAGNOSIS — G4733 Obstructive sleep apnea (adult) (pediatric): Secondary | ICD-10-CM

## 2022-06-28 DIAGNOSIS — I10 Essential (primary) hypertension: Secondary | ICD-10-CM

## 2022-06-28 MED ORDER — NORTRIPTYLINE 25 MG PO CAP
25 mg | ORAL_CAPSULE | Freq: Every evening | ORAL | 5 refills | Status: AC
Start: 2022-06-28 — End: ?

## 2022-06-28 MED ORDER — GABAPENTIN 300 MG PO CAP
300 mg | ORAL_CAPSULE | Freq: Three times a day (TID) | ORAL | 5 refills
Start: 2022-06-28 — End: ?

## 2022-06-28 NOTE — Progress Notes
Date of Service: 06/28/2022    Referral Physician: Dr. Steva Ready    Subjective:             Jonathan Crosby is a 56 y.o. male with polyneuropathy and head pain.     History of Present Illness  Jonathan Crosby 56 y.o. male with history of anxiety, HTN, OSA on CPAP and imbalance who presented to neurology for follow up of numbness and tingling of arms and legs.      Patient was last seen in clinic for numbness and tingling of extremities and had work up with EMG which showed neuropathy. He was found to have low vitamin B12 and was started on replacement. For neuropathic pain was increased on gabapentin to 300mg  TID. He reports that the gabapentin has been helping him. He still continues to have fatigue and some pain. He is concern about his stamina specially while he is at work.     He continued to have the occipital neuralgia pain on the right side. He feels like the gabapentin has been helping. Its has throbbing quality. He has nausea but denies photophobia and phonophobia.     Review of Records:   Patient was referred to me from Dr. Steva Ready for evaluation of weakness and neuropathy.     Medical History:   Diagnosis Date   ? Acid reflux 07/18/2019   ? Anxiety 07/18/2019   ? Cervicalgia    ? Diverticulitis    ? Family history of premature CAD 07/18/2019   ? Hypertension 07/18/2019   ? Neuropathy    ? OSA on CPAP 07/18/2019   ? Weakness          Surgical History:   Procedure Laterality Date   ? EAR SURGERY     ? HIP REPLACEMENT Bilateral          Social History     Socioeconomic History   ? Marital status: Single   Tobacco Use   ? Smoking status: Every Day     Packs/day: 1.50     Types: Cigarettes   ? Smokeless tobacco: Never   Substance and Sexual Activity   ? Alcohol use: Not Currently     Comment: Bourbon and Beer Daily   ? Drug use: Never         Family History   Problem Relation Age of Onset   ? Heart Attack Father 32   ? Heart Disease Father    ? Hypertension Father    ? Heart Attack Paternal Grandfather 60   ? Heart Disease Paternal Grandfather    ? Hypertension Paternal Grandfather    ? Irritable Bowel Disease Mother        No Known Allergies         Review of Systems      Objective:         ? cyanocobalamin (vitamin B-12) (PHYSICIANS EZ USE B-12) 1,000 mcg/mL kit Inject 1 ml into muscle daily for 6 days, then weekly for 4 weeks, then monthly for 6 months.  Indications: inadequate vitamin B12   ? lisinopriL (ZESTRIL) 40 mg tablet Take one tablet by mouth daily.   ? LORazepam (ATIVAN) 2 mg tablet Take one tablet by mouth three times daily as needed for Nausea.   ? NIFEdipine XL (PROCARDIA-XL) 30 mg tablet Take one tablet by mouth daily.   ? nortriptyline (PAMELOR) 25 mg capsule Take one capsule by mouth at bedtime daily.   ? pantoprazole DR (PROTONIX) 40 mg tablet Take  one tablet by mouth twice daily.   ? tadalafiL (CIALIS) 20 mg tablet Take one tablet by mouth as Needed for Erectile dysfunction.   ? testosterone (ANDROGEL PUMP) 1.62 % (20.25 mg/1.25 g) transdermal gel Daily   ? thiamine HCL (VITAMIN B-1) 100 mg tablet Take one tablet by mouth twice daily.   ? traMADoL (ULTRAM) 50 mg tablet Take one tablet to two tablets by mouth every 6-8 hours as needed for Pain.     Vitals:    06/28/22 0830   BP: (!) 151/98   BP Source: Arm, Left Upper   Pulse: 77   SpO2: 97%   PainSc: Two   Weight: 79.4 kg (175 lb)   Height: 180.3 cm (5' 10.98)     Body mass index is 24.42 kg/m?Marland Kitchen     Physical Exam  General: no acute distress, cooperative    HEENT: normocephalic, atraumatic, fundi benign   CV: well perfused  Pulm: equal chest rise, non labored breathing  Ext: no swelling, cyanosis and clubbing     Neurological Exam   Mental status: Alert and oriented to time, place, person and situation.  Speech: Fluent, with normal naming, comprehension, articulation and repetition  CN II-XII: Visual fields intact to confrontation, PERRL (4->2), EOMI, facial sensation intact. Symmetrical facial movement. Hearing grossly intact. Strong cough, elevates palate, uvula midline. Strong shoulder shrug. Tongue midline.  Motor: (R/L) b/l UE/LE 5/5 in proximal and distal muscle groups. Normal tone and bulk. No abnormal movement, fasciculation or pronator drift.  Sensory: Intact light touch. Pin prick is reduced below shin. Vibration is 4 second in big toes  Reflexes: (R/L) 2/2Bj, 2/2Trj, 2/2Brj, 2/2Knj, 2/2Aj.   Coordination/ fine movement: Normal finger to nose, heel to shin.  Gait: Normal stance and steady gait. Romberg sign absent.       Assessment and Plan:  Jonathan Crosby 56 y.o. male with history of anxiety, HTN, OSA on CPAP and imbalance who presented to neurology for follow up of neuropathy and occipital neuralgia pain.    1. Polyneuropathy, peripheral sensorimotor axonal    2. Occipital neuralgia of right side       Diagnostic work up  [employer] on 12/2021: vit B12 226, MMA 109, copper 118, ESR 4, immunofixation with normal pattern, HgA1C.  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   - Stop gabapentin due to somnolence side effect   - Advised adequate hydration to avoid dehydration related headache.        Orders Placed This Encounter   ? nortriptyline (PAMELOR) 25 mg capsule       FOLLOWUP PLAN  Return in about 6 months (around 12/29/2022).  The patient is instructed to contact me if there are any concerns with the agreed plan.        Total time 60 minutes.  Estimated counseling time was more than 50% of the visit time. Counseled patient regarding neuropathy.       Linden Dolin, MD  Clinical Assistant Professor   [facility]

## 2022-09-06 ENCOUNTER — Encounter: Admit: 2022-09-06 | Discharge: 2022-09-06 | Payer: Private Health Insurance - Indemnity

## 2022-09-06 MED ORDER — CYANOCOBALAMIN (VITAMIN B-12) 1,000 MCG/ML IJ SOLN
0 refills
Start: 2022-09-06 — End: ?

## 2022-10-04 ENCOUNTER — Encounter: Admit: 2022-10-04 | Discharge: 2022-10-04 | Payer: Private Health Insurance - Indemnity

## 2022-10-04 MED ORDER — GABAPENTIN 300 MG PO CAP
300 mg | ORAL_CAPSULE | Freq: Three times a day (TID) | ORAL | 5 refills
Start: 2022-10-04 — End: ?

## 2022-10-06 ENCOUNTER — Encounter: Admit: 2022-10-06 | Discharge: 2022-10-06 | Payer: Private Health Insurance - Indemnity

## 2022-10-19 ENCOUNTER — Encounter: Admit: 2022-10-19 | Discharge: 2022-10-19 | Payer: Private Health Insurance - Indemnity

## 2022-10-20 ENCOUNTER — Encounter: Admit: 2022-10-20 | Discharge: 2022-10-20 | Payer: Private Health Insurance - Indemnity

## 2022-10-28 ENCOUNTER — Encounter: Admit: 2022-10-28 | Discharge: 2022-10-28 | Payer: Private Health Insurance - Indemnity

## 2022-11-29 ENCOUNTER — Encounter: Admit: 2022-11-29 | Discharge: 2022-11-29 | Payer: Private Health Insurance - Indemnity

## 2022-11-29 ENCOUNTER — Ambulatory Visit: Admit: 2022-11-29 | Discharge: 2022-11-30 | Payer: Private Health Insurance - Indemnity

## 2022-11-29 DIAGNOSIS — Z8249 Family history of ischemic heart disease and other diseases of the circulatory system: Secondary | ICD-10-CM

## 2022-11-29 DIAGNOSIS — M542 Cervicalgia: Secondary | ICD-10-CM

## 2022-11-29 DIAGNOSIS — I10 Essential (primary) hypertension: Secondary | ICD-10-CM

## 2022-11-29 DIAGNOSIS — G629 Polyneuropathy, unspecified: Secondary | ICD-10-CM

## 2022-11-29 DIAGNOSIS — K219 Gastro-esophageal reflux disease without esophagitis: Secondary | ICD-10-CM

## 2022-11-29 DIAGNOSIS — K5792 Diverticulitis of intestine, part unspecified, without perforation or abscess without bleeding: Secondary | ICD-10-CM

## 2022-11-29 DIAGNOSIS — F419 Anxiety disorder, unspecified: Secondary | ICD-10-CM

## 2022-11-29 DIAGNOSIS — G4733 Obstructive sleep apnea (adult) (pediatric): Secondary | ICD-10-CM

## 2022-11-29 DIAGNOSIS — M5481 Occipital neuralgia: Secondary | ICD-10-CM

## 2022-11-29 DIAGNOSIS — R531 Weakness: Secondary | ICD-10-CM

## 2022-11-29 NOTE — Progress Notes
Date of Service: 11/29/2022    Subjective:          Dear Dr Judd Lien you for your referral of       Jonathan Crosby is a 56 y.o. male.    History of Present Illness    Neck pain and rt sided headaches  Started approx 2 years ago  Rt side of neck  Rad to back of head and over ear  Dibilitating  On HCD which helps  Saw Dr Seleta Rhymes -- started on gaba initially helped but now limited  Also interferes with driving  No weakness  Balance issues    Pain:    Where is your worst symptom? Neck   Words that best describe the quality of your symptom: Aching    Stabbing    Tiring    Nagging    Sharp   Are you having any pain? Yes   When did your pain start? Greater than 1 year   Have you been given a diagnosis for your pain? Yes   Explanation of diagnosis: Occipital Neurolgia   Is the pain constant or does it come and go? Constant   Does the pain move into your arm or leg? No   Is this issue work related? No   Was there a cause of injury (eg. fall, MVA, heavy lifting)? No   What makes the symptom better? (eg. rest, sitting down, ice, heat, medications): Rest    Ice    Medications   What makes the symptom worse? (eg. sit, stand, walk, bend): Other (comment)   Other reason that makes the symptom worse? Constant   Do you have numbness in your arm or leg? Leg   Explanation of numbness in leg from start point to end point: Ankle    Foot   Do you have weakness in your arm or leg? Leg   Have you lost control of bladder or bowel function? No   Is the pain worse at night? No   Have you had any unplanned weight loss? No   Pain Scale    If you are here for pain. Where is the pain? Neck or upper back    Headache   On a scale of 0 to 10, how bad is your neck or upper back pain? 8   On a scale of 0 to 10, what was your average neck or upper back pain level in the past week? 8   On a scale of 0 to 10, how bad is your head pain? 8   On a scale of 0 to 10, what was your average headache pain level in the past week? 8   Tests for this condition:    Have you had any of the following tests for this condition? CT scan    MRI    Nerve study/EMG   What was the approximate date of CT scan? 11/25/2020   List the facility where CT scan took [facility]: [facility]   What was the approximate date of MRI? 01/26/2021   List the facility where MRI took place: [facility]   What was the approximate date of Nerve Conduction / EMG? 12/27/2021   List the facility where Nerve conduction / EMG took place: [facility]) tried for this condition:    What conservative therapies have you done for this condition? Physical therapy    Injection   List the facility where you received physical therapy?  [facility]   What was the approximate date physical therapy was started? 02/23/2021   How long did you do physical therapy? (in weeks) 4   What was the percentage of relief from having physical therapy? Less than 50%   What was the approximate date of your last injection? 06/25/2020   What type of spine injection have you had in the past? Unknown   Please explain other injections:    What was the percentage of relief from receiving an injection? Less than 50%   Have you had spine surgery? No   Medications for Condition    Are you currently or have you in the past taken the following prescribed medications and found them effective in controlling your symptoms? Gabapentin (Neurontin)    Hydrocodone    Ibuprofen (Advil, Motrin)    Nortriptyline (pamelor)   Are you currently or have you in the past taken the following prescribed medications and found them ineffective in controlling your symptoms?    Blood Thinner    Are you taking one of the following blood thinners? No        Review of Systems    Review of Systems   Constitutional: Positive for fatigue.   HENT: Neck pain and HA    Eyes: Negative.    Respiratory: Negative.    Cardiovascular: Negative.    Gastrointestinal: Negative.    Genitourinary: Negative.    Musculoskeletal: Positive for myalgias and arthralgias.   Neurological: Positive for numbness.   Hematological: Negative.    Psychiatric/Behavioral: Negative.      Objective:          cyanocobalamin (vitamin B-12) (PHYSICIANS EZ USE B-12) 1,000 mcg/mL kit Inject 1 ml into muscle daily for 6 days, then weekly for 4 weeks, then monthly for 6 months.  Indications: inadequate vitamin B12    gabapentin (NEURONTIN) 300 mg capsule TAKE ONE CAPSULE BY MOUTH THREE TIMES DAILY    HYDROcodone/acetaminophen (NORCO) 7.5/325 mg tablet TAKE ONE TABLET BY MOUTH TWICE DAILY AS NEEDED FOR PAIN. Take in place of ibuprofen, not in addition to FOR ITCHING. Caution with lorazepam.    IBUPROFEN PO Take  by mouth.    lisinopriL (ZESTRIL) 40 mg tablet Take one tablet by mouth daily.    LORazepam (ATIVAN) 2 mg tablet Take one tablet by mouth three times daily as needed for Nausea.    NIFEdipine XL (PROCARDIA-XL) 30 mg tablet Take one tablet by mouth daily.    nortriptyline (PAMELOR) 25 mg capsule Take one capsule by mouth at bedtime daily.    pantoprazole DR (PROTONIX) 40 mg tablet Take one tablet by mouth twice daily.    tadalafiL (CIALIS) 20 mg tablet Take one tablet by mouth as Needed for Erectile dysfunction.    testosterone (ANDROGEL PUMP) 1.62 % (20.25 mg/1.25 g) transdermal gel Daily    thiamine HCL (VITAMIN B-1) 100 mg tablet Take one tablet by mouth twice daily.    traMADoL (ULTRAM) 50 mg tablet Take one tablet to two tablets by mouth every 6-8 hours as needed for Pain.     Vitals:    11/29/22 0938   BP: (!) 147/87   Pulse: 100   Temp: 36.9 ?C (98.4 ?F)   SpO2: 94%   TempSrc: Skin   PainSc: Three   Weight: 71.7 kg (158 lb)   Height: 182.9 cm (6')  Comment: per pt     Body mass index is 21.43 kg/m?Marland Kitchen     Physical Exam    General Appearance:  no distress     Respiratory Effort: breathing comfortably, no respiratory distress   Gait & Station: walks without assistance   Muscle Strength: normal muscle tone   Orientation: oriented to time, place and person Affect & Mood: appropriate and sustained affect   Language and Memory: patient responsive and seems to comprehend information   Neurologic Exam: neurological assessment grossly intact   Hoffman neg  Motor 5/5 all  TTP rt occipital area  Other: moves all extremities         Assessment and Plan:  1. Neck pain  AMB REFERRAL TO PHYSICAL THERAPY      2. Occipital neuralgia of right side  AMB REFERRAL TO PHYSICAL THERAPY            Rev records and imaging  Dicussed pain sources  Cont medications  Start PT  Occipital nerve block today  Fu in 3 m  Risks discussed  Questions answered    Thank you for allowing [place] to participate in the care of your patient.    Sincerely    Rudell Cobb MD  Professor  Anesthesia and Pain Medicine  [facility]

## 2022-11-30 ENCOUNTER — Encounter: Admit: 2022-11-30 | Discharge: 2022-11-30 | Payer: Private Health Insurance - Indemnity

## 2022-11-30 DIAGNOSIS — M5481 Occipital neuralgia: Secondary | ICD-10-CM

## 2023-01-18 ENCOUNTER — Encounter: Admit: 2023-01-18 | Discharge: 2023-01-18 | Payer: Private Health Insurance - Indemnity

## 2023-01-18 ENCOUNTER — Ambulatory Visit: Admit: 2023-01-18 | Discharge: 2023-01-19 | Payer: Private Health Insurance - Indemnity

## 2023-01-18 DIAGNOSIS — M5481 Occipital neuralgia: Secondary | ICD-10-CM

## 2023-01-18 DIAGNOSIS — G629 Polyneuropathy, unspecified: Secondary | ICD-10-CM

## 2023-01-18 DIAGNOSIS — R531 Weakness: Secondary | ICD-10-CM

## 2023-01-18 DIAGNOSIS — Z8249 Family history of ischemic heart disease and other diseases of the circulatory system: Secondary | ICD-10-CM

## 2023-01-18 DIAGNOSIS — K219 Gastro-esophageal reflux disease without esophagitis: Secondary | ICD-10-CM

## 2023-01-18 DIAGNOSIS — G608 Other hereditary and idiopathic neuropathies: Secondary | ICD-10-CM

## 2023-01-18 DIAGNOSIS — F419 Anxiety disorder, unspecified: Secondary | ICD-10-CM

## 2023-01-18 DIAGNOSIS — M542 Cervicalgia: Secondary | ICD-10-CM

## 2023-01-18 DIAGNOSIS — G4733 Obstructive sleep apnea (adult) (pediatric): Secondary | ICD-10-CM

## 2023-01-18 DIAGNOSIS — I10 Essential (primary) hypertension: Secondary | ICD-10-CM

## 2023-01-18 DIAGNOSIS — K5792 Diverticulitis of intestine, part unspecified, without perforation or abscess without bleeding: Secondary | ICD-10-CM

## 2023-01-18 DIAGNOSIS — G43809 Other migraine, not intractable, without status migrainosus: Secondary | ICD-10-CM

## 2023-01-18 MED ORDER — NORTRIPTYLINE 25 MG PO CAP
50 mg | ORAL_CAPSULE | Freq: Every evening | ORAL | 5 refills | Status: AC
Start: 2023-01-18 — End: ?

## 2023-01-18 NOTE — Progress Notes
Date of Service: 01/18/2023    Referral Physician: Dr. Steva Ready    Subjective:             Jonathan Crosby is a 57 y.o. male with polyneuropathy and head pain.     History of Present Illness  Jonathan Crosby 57 y.o. male with history of anxiety, HTN, OSA on CPAP, alcohol use and imbalance who presented to neurology for follow up of numbness and tingling of arms and legs.      Patient was last seen in clinic on 06/2022 for neuropathy and right occipital neuralgia. He was switched to nortriptyline. He states that he quit drinking alcohol for an extended period of time and thought that would improve his neuropathy. He states that when he stopped drinking the numbness and tingling did improve. The Nortriptyline and Gabapentin seem to help as well. He started drinking again and feels that the numbness and tingling has increased lately.     He continues to have occipital neuralgia pain on the right side. He states that he continues to have this pain. He describes it as a constant pain that is throbbing that stops at the base of his neck and moves up the back of his head. He has tearing from the right eye and feels that his right eye is closed most of the day related to the pain. He has sensitivity to light and nausea. He states the Gabapentin helps with the pain and it made him able to go back to work because the pain improved. He does feel like the Gabapentin makes him tired. He recently retired from his job because he felt that the tiredness was making him unable to do his job. The Norco seems to help him with the pain the best he states.    He reports that he has had occipital nerve block which helped the headaches and interested in radiofrequency ablation.     Review of Records:   Patient was referred to me from Dr. Steva Ready for evaluation of weakness and neuropathy.     Medical History:   Diagnosis Date    Acid reflux 07/18/2019    Anxiety 07/18/2019    Cervicalgia     Diverticulitis     Family history of premature CAD 07/18/2019    Hypertension 07/18/2019    Neuropathy     Occipital neuralgia     OSA on CPAP 07/18/2019    Weakness          Surgical History:   Procedure Laterality Date    EAR SURGERY      HIP REPLACEMENT Bilateral          Social History     Socioeconomic History    Marital status: Single   Tobacco Use    Smoking status: Every Day     Packs/day: 1.5     Types: Cigarettes    Smokeless tobacco: Never   Substance and Sexual Activity    Alcohol use: Not Currently     Comment: Bourbon and Beer Daily    Drug use: Never         Family History   Problem Relation Age of Onset    Heart Attack Father 36    Heart Disease Father     Hypertension Father     Heart Attack Paternal Grandfather 31    Heart Disease Paternal Grandfather     Hypertension Paternal Grandfather     Irritable Bowel Disease Mother  No Known Allergies         Review of Systems      Objective:          cyanocobalamin (vitamin B-12) (PHYSICIANS EZ USE B-12) 1,000 mcg/mL kit Inject 1 ml into muscle daily for 6 days, then weekly for 4 weeks, then monthly for 6 months.  Indications: inadequate vitamin B12    gabapentin (NEURONTIN) 300 mg capsule TAKE ONE CAPSULE BY MOUTH THREE TIMES DAILY    HYDROcodone/acetaminophen (NORCO) 7.5/325 mg tablet TAKE ONE TABLET BY MOUTH TWICE DAILY AS NEEDED FOR PAIN. Take in place of ibuprofen, not in addition to FOR ITCHING. Caution with lorazepam.    IBUPROFEN PO Take  by mouth.    lisinopriL (ZESTRIL) 40 mg tablet Take one tablet by mouth daily.    LORazepam (ATIVAN) 2 mg tablet Take one tablet by mouth three times daily as needed for Nausea.    NIFEdipine XL (PROCARDIA-XL) 30 mg tablet Take one tablet by mouth daily.    nortriptyline (PAMELOR) 25 mg capsule Take two capsules by mouth at bedtime daily.    pantoprazole DR (PROTONIX) 40 mg tablet Take one tablet by mouth twice daily.    tadalafiL (CIALIS) 20 mg tablet Take one tablet by mouth as Needed for Erectile dysfunction.    testosterone (ANDROGEL PUMP) 1.62 % (20.25 mg/1.25 g) transdermal gel Daily    thiamine HCL (VITAMIN B-1) 100 mg tablet Take one tablet by mouth twice daily.    traMADoL (ULTRAM) 50 mg tablet Take one tablet to two tablets by mouth every 6-8 hours as needed for Pain.     Vitals:    01/18/23 0817   BP: 127/87   BP Source: Arm, Left Upper   Pulse: 101   SpO2: 99%   PainSc: Three   Weight: 68.5 kg (151 lb)     Body mass index is 20.48 kg/m?Marland Kitchen     Physical Exam  General: no acute distress, cooperative    HEENT: normocephalic, atraumatic, fundi benign   CV: well perfused  Pulm: equal chest rise, non labored breathing  Ext: no swelling, cyanosis and clubbing     Neurological Exam   Mental status: Alert and oriented to time, place, person and situation.  Speech: Fluent, with normal naming, comprehension, articulation and repetition  CN II-XII: Visual fields intact to confrontation, PERRL (4->2), EOMI, facial sensation intact. Symmetrical facial movement. Hearing grossly intact. Strong cough, elevates palate, uvula midline. Strong shoulder shrug. Tongue midline.  Motor: (R/L) b/l UE/LE 5/5 in proximal and distal muscle groups. Normal tone and bulk. No abnormal movement, fasciculation or pronator drift.  Sensory: Intact light touch. Pin prick is reduced below shin. Vibration is 4 second in big toes  Reflexes: (R/L) 2/2Bj, 2/2Trj, 2/2Brj, 2/2Knj, 2/2Aj.   Coordination/ fine movement: Normal finger to nose, heel to shin.  Gait: Normal stance and steady gait. Romberg sign absent.       Assessment and Plan:  Jonathan Crosby 57 y.o. male with history of anxiety, HTN, OSA on CPAP, alcohol use and imbalance who presented to neurology for follow up of neuropathy and occipital neuralgia pain.    1. Polyneuropathy, peripheral sensorimotor axonal    2. Occipital neuralgia of right side    3. Cervicogenic migraine       Diagnostic work up  [employer] on 12/2021: vit B12 226, MMA 109, copper 118, ESR 4, immunofixation with normal pattern, HgA1C.  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 Chronic right  C7 radiculopathy, non-active. 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   - Increase Nortriptyline to 50mg  at bedtime. Patient was educated on the effects of drinking alcohol and taking this medication. Side effects of this medication are dry mouth, drowsiness and increased heart rate.  - Continue with recommendation of physical therapy for neck pain.  - Continue with gabapentin for now and may need to get radiofrequency ablation or repeat occipital nerve block.        FOLLOWUP PLAN  Return in about 6 months (around 07/19/2023).  The patient is instructed to contact me if there are any concerns with the agreed plan.  Problem   Cervicogenic Migraine        Total time 30 minutes.  Estimated counseling time was more than 50% of the visit time. Counseled patient regarding neuropathy.       Linden Dolin, MD  Clinical Assistant Professor   [facility]

## 2023-02-21 ENCOUNTER — Encounter: Admit: 2023-02-21 | Discharge: 2023-02-21 | Payer: Private Health Insurance - Indemnity

## 2023-03-02 ENCOUNTER — Encounter: Admit: 2023-03-02 | Discharge: 2023-03-02 | Payer: Private Health Insurance - Indemnity

## 2023-03-02 NOTE — Telephone Encounter
Pt called to have PT orders/referral sent to PT. RN lvm to hav him leave a fax number and location and we will fax it.

## 2023-03-28 ENCOUNTER — Ambulatory Visit: Admit: 2023-03-28 | Discharge: 2023-03-28 | Payer: Private Health Insurance - Indemnity

## 2023-03-28 ENCOUNTER — Encounter: Admit: 2023-03-28 | Discharge: 2023-03-28 | Payer: Private Health Insurance - Indemnity

## 2023-03-28 DIAGNOSIS — I10 Essential (primary) hypertension: Secondary | ICD-10-CM

## 2023-03-28 DIAGNOSIS — M542 Cervicalgia: Secondary | ICD-10-CM

## 2023-03-28 DIAGNOSIS — K5792 Diverticulitis of intestine, part unspecified, without perforation or abscess without bleeding: Secondary | ICD-10-CM

## 2023-03-28 DIAGNOSIS — K5732 Diverticulitis of large intestine without perforation or abscess without bleeding: Secondary | ICD-10-CM

## 2023-03-28 DIAGNOSIS — K219 Gastro-esophageal reflux disease without esophagitis: Secondary | ICD-10-CM

## 2023-03-28 DIAGNOSIS — F419 Anxiety disorder, unspecified: Secondary | ICD-10-CM

## 2023-03-28 DIAGNOSIS — M5481 Occipital neuralgia: Secondary | ICD-10-CM

## 2023-03-28 DIAGNOSIS — R531 Weakness: Secondary | ICD-10-CM

## 2023-03-28 DIAGNOSIS — G629 Polyneuropathy, unspecified: Secondary | ICD-10-CM

## 2023-03-28 DIAGNOSIS — Z8249 Family history of ischemic heart disease and other diseases of the circulatory system: Secondary | ICD-10-CM

## 2023-03-28 DIAGNOSIS — M47812 Spondylosis without myelopathy or radiculopathy, cervical region: Secondary | ICD-10-CM

## 2023-03-28 DIAGNOSIS — G43809 Other migraine, not intractable, without status migrainosus: Secondary | ICD-10-CM

## 2023-03-28 DIAGNOSIS — G4733 Obstructive sleep apnea (adult) (pediatric): Secondary | ICD-10-CM

## 2023-03-28 MED ORDER — TRIAMCINOLONE ACETONIDE 40 MG/ML IJ SUSP
40 mg | Freq: Once | INTRAMUSCULAR | 0 refills | Status: CP | PRN
Start: 2023-03-28 — End: ?

## 2023-03-28 MED ORDER — BUPIVACAINE (PF) 0.25 % (2.5 MG/ML) IJ SOLN
2 mL | Freq: Once | INTRAMUSCULAR | 0 refills | Status: CP | PRN
Start: 2023-03-28 — End: ?

## 2023-03-28 NOTE — Progress Notes
Date of Service: 03/28/2023    Subjective:             Jonathan Crosby is a 58 y.o. male.    History of Present Illness  Recurrent rt sided headache  Last seen a few months ago  Good but temp relief with occ nerve block  On various meds  Wants to wean off pain meds  Frustrated with recurrent pain  Here to discuss options     Review of Systems      Objective:          cyanocobalamin (vitamin B-12) (PHYSICIANS EZ USE B-12) 1,000 mcg/mL kit Inject 1 ml into muscle daily for 6 days, then weekly for 4 weeks, then monthly for 6 months.  Indications: inadequate vitamin B12    gabapentin (NEURONTIN) 300 mg capsule TAKE ONE CAPSULE BY MOUTH THREE TIMES DAILY    HYDROcodone/acetaminophen (NORCO) 7.5/325 mg tablet TAKE ONE TABLET BY MOUTH TWICE DAILY AS NEEDED FOR PAIN. Take in place of ibuprofen, not in addition to FOR ITCHING. Caution with lorazepam.    IBUPROFEN PO Take  by mouth.    lisinopriL (ZESTRIL) 40 mg tablet Take one tablet by mouth daily.    LORazepam (ATIVAN) 2 mg tablet Take one tablet by mouth three times daily as needed for Nausea.    NIFEdipine XL (PROCARDIA-XL) 30 mg tablet Take one tablet by mouth daily.    nortriptyline (PAMELOR) 25 mg capsule Take two capsules by mouth at bedtime daily.    pantoprazole DR (PROTONIX) 40 mg tablet Take one tablet by mouth twice daily.    tadalafiL (CIALIS) 20 mg tablet Take one tablet by mouth as Needed for Erectile dysfunction.    testosterone (ANDROGEL PUMP) 1.62 % (20.25 mg/1.25 g) transdermal gel Daily    thiamine HCL (VITAMIN B-1) 100 mg tablet Take one tablet by mouth twice daily.    traMADoL (ULTRAM) 50 mg tablet Take one tablet to two tablets by mouth every 6-8 hours as needed for Pain.     Vitals:    03/28/23 0944   BP: 126/87   BP Source: Arm, Left Upper   Pulse: 109   Temp: 36.3 ?C (97.3 ?F)   Resp: 16   SpO2: 98%   TempSrc: Temporal   PainSc: Zero   Weight: 70 kg (154 lb 4.8 oz)   Height: 182.9 cm (6' 0.01)     Body mass index is 20.92 kg/m?Marland Kitchen     Physical Exam Assessment and Plan:  1. Cervicogenic migraine  Nerve Block      2. Occipital neuralgia of right side  Nerve Block      3. Cervical spondylosis  Nerve Block          Rev recordsa nd imaging  Disucssed pain soruces  Cont medications  Cont exercises  Repeat occ nerve blocktoday  Plan for cerv MBB then RFA  Risks discussed   Questions answered

## 2023-03-28 NOTE — Procedures
Attending Surgeon: Jerilynn Som, MD    Anesthesia: Local    Pre-Procedure Diagnosis:   1. Cervicogenic migraine    2. Occipital neuralgia of right side    3. Cervical spondylosis        Post-Procedure Diagnosis:   1. Cervicogenic migraine    2. Occipital neuralgia of right side    3. Cervical spondylosis        Nerve Block  Nerve: Occipital - Greater  Laterality: right   on 03/28/2023 9:45 AM    Consent:   Consent obtained: written  Consent given by: patient    Discussed with patient the purpose of the treatment/procedure, other ways of treating my condition, including no treatment/ procedure and the risks and benefits of the alternatives. Patient has decided to proceed with treatment/procedure.     Procedures Details:   Indications: Neuritis and Pain Relief  Preparation: Patient was prepped and draped in the usual sterile fashion.  Prep: alcohol  Patient position: sitting  Needle size: 27 G  Guidance: anatomical  Medication Administered - 2 mL bupivacaine PF 0.25 %; 40 mg triamcinolone acetonide 40 mg/mL  Outcome: Pain relieved and Pain improved  Patient tolerance: tolerated well, no immediate complications    Estimated blood loss: none or minimal  Specimens: none  Patient tolerated the procedure well with no immediate complications. Pressure was applied, and hemostasis was accomplished.

## 2023-04-03 ENCOUNTER — Encounter: Admit: 2023-04-03 | Discharge: 2023-04-03 | Payer: Private Health Insurance - Indemnity

## 2023-04-03 MED ORDER — GABAPENTIN 300 MG PO CAP
300 mg | ORAL_CAPSULE | Freq: Three times a day (TID) | ORAL | 0 refills | Status: AC
Start: 2023-04-03 — End: ?

## 2023-04-03 NOTE — Telephone Encounter
Pt states he has had labs done with his PCP recently and will have those results sent to our office.

## 2023-04-04 ENCOUNTER — Encounter: Admit: 2023-04-04 | Discharge: 2023-04-04 | Payer: Private Health Insurance - Indemnity

## 2023-04-04 NOTE — Telephone Encounter
Occipital nerve block treatment could be helpful for occipital neuralgia related head pain. If this injection is helping thus far then the radiofrequency ablation could provide additional benefit. He could get this done and I hope that completely reliefs his pain.

## 2023-04-19 ENCOUNTER — Encounter: Admit: 2023-04-19 | Discharge: 2023-04-19

## 2023-04-19 MED ORDER — CYANOCOBALAMIN (VITAMIN B-12) 1,000 MCG/ML IJ SOLN
0 refills
Start: 2023-04-19 — End: ?

## 2023-04-26 ENCOUNTER — Encounter: Admit: 2023-04-26 | Discharge: 2023-04-26

## 2023-05-19 ENCOUNTER — Encounter: Admit: 2023-05-19 | Discharge: 2023-05-19

## 2023-06-23 ENCOUNTER — Encounter: Admit: 2023-06-23 | Discharge: 2023-06-23

## 2023-07-20 ENCOUNTER — Ambulatory Visit: Admit: 2023-07-20 | Discharge: 2023-07-21 | Payer: Private Health Insurance - Indemnity

## 2023-07-20 ENCOUNTER — Encounter: Admit: 2023-07-20 | Discharge: 2023-07-20 | Payer: Private Health Insurance - Indemnity

## 2023-07-20 DIAGNOSIS — K5732 Diverticulitis of large intestine without perforation or abscess without bleeding: Secondary | ICD-10-CM

## 2023-07-20 DIAGNOSIS — G629 Polyneuropathy, unspecified: Secondary | ICD-10-CM

## 2023-07-20 DIAGNOSIS — R531 Weakness: Secondary | ICD-10-CM

## 2023-07-20 DIAGNOSIS — R519 Generalized headaches: Secondary | ICD-10-CM

## 2023-07-20 DIAGNOSIS — K5792 Diverticulitis of intestine, part unspecified, without perforation or abscess without bleeding: Secondary | ICD-10-CM

## 2023-07-20 DIAGNOSIS — G43809 Other migraine, not intractable, without status migrainosus: Secondary | ICD-10-CM

## 2023-07-20 DIAGNOSIS — M542 Cervicalgia: Secondary | ICD-10-CM

## 2023-07-20 DIAGNOSIS — G608 Other hereditary and idiopathic neuropathies: Secondary | ICD-10-CM

## 2023-07-20 DIAGNOSIS — M5481 Occipital neuralgia: Secondary | ICD-10-CM

## 2023-07-20 DIAGNOSIS — F419 Anxiety disorder, unspecified: Secondary | ICD-10-CM

## 2023-07-20 DIAGNOSIS — I10 Essential (primary) hypertension: Secondary | ICD-10-CM

## 2023-07-20 DIAGNOSIS — K219 Gastro-esophageal reflux disease without esophagitis: Secondary | ICD-10-CM

## 2023-07-20 DIAGNOSIS — G4733 Obstructive sleep apnea (adult) (pediatric): Secondary | ICD-10-CM

## 2023-07-20 DIAGNOSIS — Z8249 Family history of ischemic heart disease and other diseases of the circulatory system: Secondary | ICD-10-CM

## 2023-07-20 NOTE — Patient Instructions
-- Preferred method of communication is through [employer], if the issue cannot wait until your next scheduled follow up.   -- MyChart may be used for non-emergent communication. Emails are not reviewed after hours or over the weekend/holidays/after 4PM. Staff will reply to your email within 24-48 business hours.       -- If you do not hear from [place] within one week of a lab or imaging study being completed, please call/send my chart email to the office to be sure that we have received the results. This is especially challenging when tests are done outside of the [facility] system, as many times results do not make it back to our office for a variety of reasons. In our office no news is good news does not apply. You should hear from [place] with results for each test.  [facility] lab/imaging results:  Due to the CURES act, results automatically release to MyChart.  Dr. Ala Dach will continue to send you a result note on any labs that she orders.  With these changes you may see your results before Dr. Ala Dach does.   Please allow up to 72 hours for review and response to your results.   Also, due to the CURES act, notes are released automatically. There may be terminology or abbreviations that are difficult to understand, as these are for peer to peer communication. The portions of the note included in your after visit summary are to communicate to you the plan of care for the day.     -- If you are having acute (new/sudden onset) or severe/worsening neurologic symptoms, please call 911 or seek care in ED.    -- For scheduling of IMAGING/RADIOLOGY, please call [phone] at your convenience to schedule your studies.  -- For referrals placed during the visit, if you have not heard from scheduling within one week, please call the call center at [phone] to get scheduling assistance.  -- For refills on medications, please first contact your pharmacy, who will fax a refill authorization request form to our office.  Weekdays only. Allow up to 2 business days for refills. Please plan ahead, as refills will not be filled after hours.  -- Our front desk staff, Genice Rouge, or Marcelino Duster may be reached at [phone] for scheduling needs.   Chari Manning, RN or Huntley Dec, RN may be contacted at [phone] for urgent needs. Staff will return your call within 24 business hours.     For Appointments:   -- Please try to arrive early for your appointment time to help facilitate your visit. 15 minutes early is recommended.   -- If you are late to your appointment, we reserve the right to ask you to reschedule or wait until next available time to be seen in fairness to other patients scheduled that day.   -- There are times when we are running behind in clinic. Our goal is to always be on time, however, there are time when unexpected events occur with patients, which may cause a delay. We appreciate your understanding when this occurs.                   --**If you become pregnant or are planning to become pregnant, please call your doctor if taking headache prevention or acute headache medications.**     -- Please be aware of the slight risk of serotonin syndrome with combining anti depressant medications with triptan medications. If you develop any confusion/irritation/agitation, tremors, muscle rigidity, muscle jerking, fevers, please stop the  medications and go to ER for evaluation.     Medications found Over the Counter that you may try for prevention:   If you choose to add these, I suggest adding one supplement every 2-4 weeks to give time to assess for any adverse effects of each.   Magnesium: Magnesium (250 mg twice a day or 500 mg at bed) has a relaxant effect on smooth muscles such as blood vessels. Magnesium is part of the messenger system in the serotonin cascade and it is somewhat of a muscle relaxant. It is also useful for constipation which can be a side effect of other medications used to treat migraine. --> May cause diarrhea.  Riboflavin (vitamin B 2) 200 mg twice a day. Clinical trials of riboflavin using 400 mg per day all of which suggested that migraine frequency can be decreased. All 3 trials showed significant improvement in over half of migraine sufferers. The supplement is found in bread, cereal, milk, meat, and poultry. --> May turn urine brighter color  Coenzyme Q10: Studies have shown that a nutritional supplement of CoQ10 100mg  three times daily can reduce the frequency of migraine attacks by improving the energy production of cells as with riboflavin.     General Headache Education:   -- Realistic expectations of Chronic headaches:  We hope for eventual 50% reduction in headaches frequency and/or intensity. Need to try drug for at least 90 days at therapeutic dose, unless significant side effects/adverse effects. Often it will take combination of multiple medications and/or procedures to provide some relief.   Please keep in mind that it takes 4-6 weeks for the medication to start working well and 2-3 months at the appropriate dose before deciding if it will be useful or not. If it is not helping at all by this time, then we will discuss other medications to try. Supplements may take 3-6 months until you see full effect.   -- Relaxation: Find something that you enjoy to engage in at least 15 minutes per day: stretching/breathing, mindfulness/meditation, yoga, pilates, tai chi, etc. [facility] in town does have some free classes to join for training on these techniques. There are also apps for your phone, and even youtube videos that may help with this.  -- Sleep hygiene: Please attempt same bedtime and wake time each day. Also try to obtain 8 hours of sleep each night. Monitor for any symptoms of restless legs or vivid dreaming.   -- Fluid intake: It is important to take in at least 40 oz. of fluid per day.  -- Diet/Exercise: It is important to moderate dietary intake of sugars/carbohydrates, caffeine, red wine, cheese, chocolate, processed foods, as these *may* be triggers. Exercising regularly is also recommended.  A healthy/normal BMI is recommended. You may try the headache diary and trigger sheet to see if you are able to identify any triggers.  --Many headache prevention medications can worsen depression initially and even cause suicidal ideas. If you feel like you could harm yourself or others, please go to a local ED or call 911.   -- Please note that most headache medications that are prescribed for both prevention and treatment are actually off label for this indication. There are small reviews of the medications that have gained their American Academy of Neurology recommendation for use. As with any medication, these can cause side effects. Please be aware of the most common side effects and call the office with any concerns.  -- Please keep a headache diary so that we  can track the # of days per month and the intensity on a 10 point scale. This will be helpful when adding/changing medications.   -- Due to episodic or chronic pain/chronic migraines, Cognitive Behavioral Therapy, Biofeedback, and general counseling are often also helpful. [facility] is a Health and safety inspector, as well as seeing a Airline pilot regularly is recommended.  -- Some of the medications that you may be prescribed may cause sedation, fatigue and/or dizziness/lightheadedness. Please avoid driving, operating heavy machinery, and/or making important decisions while initially starting these medications. Also be aware that when combining several potentially sedating medications, sedating effect may become significantly more pronounced. Do not combine medications with alcohol.     -- Limit use of acute treatments to no more than 2 days per week or 10 days per month to prevent medication overuse headache (rebound headache).     Foods and beverages which may trigger migraine  Only around 20% of headache patients are food sensitive.   Chocolate, other sweets , ALL cheeses except cottage and cream cheese, Dairy products, yogurt, sour cream, ice cream; Liver; Meat extracts (Bovril, Marmite, meat tenderizers), Meats or fish which have undergone aging, fermenting, pickling or smoking. These include: Hotdogs,salami,Lox,sausage,mortadellas,smoked salmon, pepperoni, Pickled herring, Pods of broad bean (English beans, Chinese pea pods, [place] (fava) beans, lima and navy beans, Ripe avocado, ripe banana, Yeast extracts or active yeast preparations such as Brewer's or Fleishman's (commercial bakes goods are permitted)  Tomato based foods, pizza (lasagna, etc.),   MSG (monosodium glutamate) is disguised as many things; look for these common aliases:  ? Monopotassium glutamate ? Autolysed yeast  ? Hydrolysed protein  ? Sodium caseinate  ? ?flavorings?  ? ?all natural preservatives  -Nutrasweet    Common triggers include:  Emotional triggers: ? Emotional/Upset family or friends  ? Emotional/Upset occupation  ? Business reversal/success  ? Anticipation anxiety  ? Crisis-serious  ? Post-crisis period  -New job/position    Physical triggers:  ? Vacation Day  ? Weekend  ? Strenuous Exercise  ? [place]  ? New Move  ? Menstrual Day  ? Physical Illness  ? Oversleep/Not enough sleep  ? Weather changes  -Light/bright sunlight       FOR [place] APPOINTMENT SCHEDULING/RESCHEDULING:  Radiology department - [phone].   EEG lab & Epilepsy clinic - [phone] or [phone].  Neuropsychology clinic - [phone].  Neuromuscular clinic - [phone] or [phone].  Sleep clinic - [phone].       RECOMMENDATIONS  - Advised adequate hydration to avoid dehydration related headache.   - Restart Nortriptyline 50mg  at bedtime for neuropathy, migraines, and occipital neuralgia. Discussed common side effects drowsiness, dry eyes, dry mouth. If this is to sedating, please reach out so we can prescribe a lower dose.  - Continue with recommendation of physical therapy for neck pain.  - Continue with gabapentin for now and may need to get radiofrequency ablation or repeat occipital nerve block.  - Repeat vitamin B12 level due to prior deficiency

## 2023-07-20 NOTE — Progress Notes
Date of Service: 07/20/2023    Referral Physician: Dr. Steva Ready    Subjective:             Jonathan Crosby is a 57 y.o. male with polyneuropathy and head pain.     History of Present Illness  Jonathan Crosby 57 y.o. male who is a patient of Dr. Dayton Bailiff with history of anxiety, HTN, OSA on CPAP, alcohol use and imbalance who presented to neurology for follow up of numbness and tingling of arms and legs.      MIDAS SCORE  Total Score:: 250        07/19/2023   NEUROLOGY HEADACHE SYMPTOMS QNR   What year did the headaches begin? 2022   What does the pain feel like? Constant   How severe is the pain (0-10 scale with 0 = no pain and 10 = worst pain ever felt) 8   How often do the headaches happen? Daily   How long do they last without treatment? Always treatment   Symptoms associated with the headache: Blurry vision;Dizziness;Light sensitivity;Nausea;Neck pain;Watery eyes   Do you experience an aura (changes in hearing, vision, or smell) prior to the headache? No   Any other warnings before your pain starts? No   Do you have any triggers that cause the headaches to happen? None known   Do you have a headache when you wake up? Yes   Do you snore? Yes   Do you take any medications as needed to stop a headache you already have? (ex. Motrin, Tylenol) Yes   How often do you use medicine to stop a headache you already have in a month? Less than 15 days   Days of missed work in a month: Retired as a result   ER visits: None associated   Do you have a family history of migraines? No   Do you have a history of any head injuries? None aware of, excluding marriage   Have you had any previous MRIs or CT scans performed? Yes   Do you have a history of kidney stones? No      Onset:  Location: right side of the head  Last Eye Exam:    MOH Risk: Ibuprofen (daily)      Medications:  -Nortriptyline 50mg  HS- been off this for awhile now. 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 He is open to restarting this now that he is not working  -Gabapentin 400mg  TID (prescribed by pain clinic)  -Tramadol 50mg -100mg  every 6 hours prn- no benefit  -Norco 1-2 tabs BID prn- helps for 3-6 hours. Helps the best out of all his medication.    He has history of vitamin b12 deficiency and started on IM replacement, but believes he stopped this prior to finishing 6 months of injections.    He continues to suffer daily symptoms of neuropathy and occipital neuralgia. He is rating pain a constant 3/10 that he manages with medication.     He reports 1-2 days of relief with occipital nerve blocks. He is scheduled for 8/15 for an ablation. This can be shooting pain that starts in the cervical neck that extends behind the right ear, and behind the eye.     Jonathan Crosby reports cutting back on drinking.      Clinical Summary 01/18/23 with Dr. Seleta Rhymes:  Patient was last seen in clinic on 06/2022 for neuropathy and right occipital neuralgia. He was switched to nortriptyline. He states  that he quit drinking alcohol for an extended period of time and thought that would improve his neuropathy. He states that when he stopped drinking the numbness and tingling did improve. The Nortriptyline and Gabapentin seem to help as well. He started drinking again and feels that the numbness and tingling has increased lately.     He continues to have occipital neuralgia pain on the right side. He states that he continues to have this pain. He describes it as a constant pain that is throbbing that stops at the base of his neck and moves up the back of his head. He has tearing from the right eye and feels that his right eye is closed most of the day related to the pain. He has sensitivity to light and nausea. He states the Gabapentin helps with the pain and it made him able to go back to work because the pain improved. He does feel like the Gabapentin makes him tired. He recently retired from his job because he felt that the tiredness was making him unable to do his job. The Norco seems to help him with the pain the best he states.    He reports that he has had occipital nerve block which helped the headaches and interested in radiofrequency ablation.     Diagnostic work up  [employer] on 12/2021: vit B12 226, MMA 109, copper 118, ESR 4, immunofixation with normal pattern, HgA1C.  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 Chronic right C7 radiculopathy, non-active. 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50     Average packs/day: 1.5 packs/day for 5.0 years (7.5 ttl pk-yrs)     Types: Cigarettes    Smokeless tobacco: Never   Substance and Sexual Activity    Alcohol use: Yes     Alcohol/week: 12.0 standard drinks of alcohol     Types: 12 Cans of beer per week     Comment: Bourbon and Beer Daily    Drug use: Never    Sexual activity: Not Currently     Partners: Female         Family History   Problem Relation Name Age of Onset    Heart Attack Father Jonathan Crosby 50    Heart Disease Father Jonathan Crosby     Hypertension Father Jonathan Crosby     Heart Attack Paternal Jonathan Crosby 16    Heart Disease Paternal Grandfather Jonathan Crosby     Hypertension Paternal Grandfather Jonathan Crosby     Irritable Bowel Disease Mother         No Known Allergies  Review of Systems      Objective:          cyanocobalamin (vitamin B-12) (PHYSICIANS EZ USE B-12) 1,000 mcg/mL kit Inject 1 ml into muscle daily for 6 days, then weekly for 4 weeks, then monthly for 6 months.  Indications: inadequate vitamin B12    gabapentin (NEURONTIN) 400 mg capsule Take one capsule by mouth three times daily.    HYDROcodone/acetaminophen (NORCO) 7.5/325 mg tablet TAKE ONE TABLET BY MOUTH TWICE DAILY AS NEEDED FOR PAIN. Take in place of ibuprofen, not in addition to FOR ITCHING. Caution with lorazepam.    IBUPROFEN PO Take  by mouth.    lisinopriL (ZESTRIL) 40 mg tablet Take one tablet by mouth daily.    LORazepam (ATIVAN) 2 mg tablet Take one tablet by mouth three times daily as needed for Nausea.    NIFEdipine XL (PROCARDIA-XL) 30 mg tablet Take one tablet by mouth daily.    nortriptyline (PAMELOR) 25 mg capsule Take two capsules by mouth at bedtime daily.    pantoprazole DR (PROTONIX) 40 mg tablet Take one tablet by mouth twice daily.    tadalafiL (CIALIS) 20 mg tablet Take one tablet by mouth as Needed for Erectile dysfunction.    testosterone (ANDROGEL PUMP) 1.62 % (20.25 mg/1.25 g) transdermal gel Daily    thiamine HCL (VITAMIN B-1) 100 mg tablet Take one tablet by mouth twice daily.    traMADoL (ULTRAM) 50 mg tablet Take one tablet to two tablets by mouth every 6-8 hours as needed for Pain.     Vitals:    07/20/23 1331   BP: 128/84   BP Source: Arm, Left Upper   Pulse: 114   SpO2: 96%   PainSc: Five   Weight: 68 kg (150 lb)   Height: 182.9 cm (6')       Body mass index is 20.34 kg/m?Marland Kitchen     Physical Exam  General: alert, oriented x 3  Speech: normal, no dysarthria  Ext: no leg edema; dry flaky skin on bilateral feet  Cranial nerves: ophthalmoscopic exam: no papilledema or optic pallor; II Visual fields full to finger counting; III, IV, VI PERRL, extraocular muscles intact, no nystagmus; V facial sensation intact; VII facial expression symmetric; VIII hearing intact to conversation  Motor: (Right/Left)  Deltoid 5/5, Biceps 5/5, Triceps 5/5, Finger ext 5/5, interossei 5/5, Hip Flexion 5/5, Knee ext 5/5, Knee flex 5/5, Ankle dorsiflexion 5/5, Ankle plantarflexion 5/5  Sensory: Normal to light touch. Proprioception intact at toes. Pinprick is decreased until bilateral ankles. Vibration felt for >10 seconds mid foot bilaterally (not felt in either GT)  Coordination: normal finger nose finger, rapid alternating movements and finger tapping speed normal, and heel to shin smooth without ataxia  Reflexes: (Right/Left) Biceps 2/2, Triceps 2/2, Brachioradialis 2/2, Knee Jerks 2/2, Ankle Jerks 2/2,   Abnormal Movements: no tremors, no rigidity, no bradykinesia  Gait: normal based, good arm swing. Tandem gait intact         Assessment and Plan:  Masato Zunk 57 y.o. male with history of anxiety, HTN, OSA on CPAP, alcohol use and imbalance who presented to neurology for follow up of neuropathy and occipital neuralgia pain.    1. Polyneuropathy, peripheral sensorimotor axonal    2. B12 deficiency    3. Occipital neuralgia of right side    4. Cervicogenic migraine           RECOMMENDATIONS  - Advised adequate hydration to avoid dehydration related headache.   - Restart Nortriptyline  50mg  at bedtime for neuropathy, migraines, and occipital neuralgia. Discussed common side effects drowsiness, dry eyes, dry mouth. If this is to sedating, please reach out so we can prescribe a lower dose.  - Continue with recommendation of physical therapy for neck pain.  - Continue with gabapentin for now and may need to get radiofrequency ablation or repeat occipital nerve block.  - Repeat vitamin B12 level due to prior deficiency       FOLLOWUP PLAN  Return in about 3 months (around 10/20/2023) for Telehealth, In-Person. Or after completing ablations with pain clinic   The patient is instructed to contact me if there are any concerns with the agreed plan.        Total of 50 minutes were spent on the same day of the visit including preparing to see the patient, obtaining   and/or reviewing separately obtained history, performing a medically appropriate examination and/or   evaluation, counseling and educating the patient/family/caregiver, ordering medications, tests, or   procedures, referring and communication with other health care professionals, documenting clinical   information in the electronic or other health record, independently interpreting results and communicating   results to the patient/family/caregiver, and care coordination.

## 2023-07-21 DIAGNOSIS — E538 Deficiency of other specified B group vitamins: Secondary | ICD-10-CM

## 2023-07-27 ENCOUNTER — Encounter: Admit: 2023-07-27 | Discharge: 2023-07-27 | Payer: Private Health Insurance - Indemnity

## 2023-07-27 ENCOUNTER — Ambulatory Visit: Admit: 2023-07-27 | Discharge: 2023-07-28 | Payer: Private Health Insurance - Indemnity

## 2023-07-27 DIAGNOSIS — K5792 Diverticulitis of intestine, part unspecified, without perforation or abscess without bleeding: Secondary | ICD-10-CM

## 2023-07-27 DIAGNOSIS — M5481 Occipital neuralgia: Secondary | ICD-10-CM

## 2023-07-27 DIAGNOSIS — M47812 Spondylosis without myelopathy or radiculopathy, cervical region: Secondary | ICD-10-CM

## 2023-07-27 DIAGNOSIS — R531 Weakness: Secondary | ICD-10-CM

## 2023-07-27 DIAGNOSIS — Z8249 Family history of ischemic heart disease and other diseases of the circulatory system: Secondary | ICD-10-CM

## 2023-07-27 DIAGNOSIS — R519 Generalized headaches: Secondary | ICD-10-CM

## 2023-07-27 DIAGNOSIS — F419 Anxiety disorder, unspecified: Secondary | ICD-10-CM

## 2023-07-27 DIAGNOSIS — K219 Gastro-esophageal reflux disease without esophagitis: Secondary | ICD-10-CM

## 2023-07-27 DIAGNOSIS — K5732 Diverticulitis of large intestine without perforation or abscess without bleeding: Secondary | ICD-10-CM

## 2023-07-27 DIAGNOSIS — I10 Essential (primary) hypertension: Secondary | ICD-10-CM

## 2023-07-27 DIAGNOSIS — G4733 Obstructive sleep apnea (adult) (pediatric): Secondary | ICD-10-CM

## 2023-07-27 DIAGNOSIS — G629 Polyneuropathy, unspecified: Secondary | ICD-10-CM

## 2023-07-27 DIAGNOSIS — M542 Cervicalgia: Secondary | ICD-10-CM

## 2023-07-27 MED ORDER — BUPIVACAINE (PF) 0.5 % (5 MG/ML) IJ SOLN
2 mL | Freq: Once | INTRAMUSCULAR | 0 refills | Status: CP
Start: 2023-07-27 — End: ?

## 2023-07-27 NOTE — Progress Notes
[facility]  INTERVENTIONAL PAIN PROCEDURE HISTORY AND PHYSICAL    Chief Complaint: Pain    HISTORY OF PRESENT ILLNESS:  rt sided neck pain and HA    Past Medical History:   Diagnosis Date    Acid reflux 07/18/2019    Anxiety 07/18/2019    Cervicalgia     Diverticulitis     Diverticulitis of colon (without mention of hemorrhage)(562.11) 12/04/2021    Family history of premature CAD 07/18/2019    Generalized headaches 2022    Hypertension 07/18/2019    Neuropathy     Occipital neuralgia     OSA on CPAP 07/18/2019    Weakness        Surgical History:   Procedure Laterality Date    EAR SURGERY      HIP REPLACEMENT Bilateral        family history includes Heart Attack (age of onset: 6) in his father and paternal grandfather; Heart Disease in his father and paternal grandfather; Hypertension in his father and paternal grandfather; Irritable Bowel Disease in his mother.    Social History     Socioeconomic History    Marital status: Single   Tobacco Use    Smoking status: Every Day     Current packs/day: 1.50     Average packs/day: 1.5 packs/day for 5.0 years (7.5 ttl pk-yrs)     Types: Cigarettes    Smokeless tobacco: Former     Types: Chew   Substance and Sexual Activity    Alcohol use: Yes     Alcohol/week: 12.0 standard drinks of alcohol     Types: 12 Cans of beer per week     Comment: Bourbon and Beer Daily    Drug use: Never    Sexual activity: Not Currently     Partners: Female       No Known Allergies    There were no vitals filed for this visit.     [place]estry Total Score:: 28    REVIEW OF SYSTEMS: 10 point ROS obtained and negative except pain      PHYSICAL EXAM:unchnaged        IMPRESSION:    1. Occipital neuralgia of right side    2. Cervical spondylosis         PLAN: Other rt cerv MBB

## 2023-07-27 NOTE — Procedures
Attending Surgeon: Jerilynn Som, MD    Anesthesia: Local    Pre-Procedure Diagnosis:   1. Occipital neuralgia of right side    2. Cervical spondylosis        Post-Procedure Diagnosis:   1. Occipital neuralgia of right side    2. Cervical spondylosis         AMB SPINE INJECT MBB CERV/THOR  Procedure: medial branch block    Laterality: right    Location: - C2 and C3      Consent:   Consent obtained: written  Consent given by: patient  Risks discussed: allergic reaction, bleeding, bruising, infection, nerve damage, reaction to medication, no change or worsening in pain, weakness and seizure  Alternatives discussed: alternative treatment, delayed treatment, no treatment and referral  Discussed with patient the purpose of the treatment/procedure, other ways of treating my condition, including no treatment/ procedure and the risks and benefits of the alternatives. Patient has decided to proceed with treatment/procedure.   Procedures Details:   Indications: pain, diagnostic evaluation and Axial Pain   Prep: chlorhexidine  Patient position: supine  Estimated Blood Loss: minimal  Specimens: none  Number of Levels: 1  Guidance: fluoroscopy  Needle and Epidural Catheter: pencil-tip  Needle size: 25 G  Injection procedure: Incremental injection, Negative aspiration for blood and Introduced needle  Patient tolerance: Patient tolerated the procedure well with no immediate complications. Pressure was applied, and hemostasis was accomplished.  Outcome: Pain relieved and Pain improved  Comments: .5ml bupi inj at each level      Estimated blood loss: none or minimal  Specimens: none  Patient tolerated the procedure well with no immediate complications. Pressure was applied, and hemostasis was accomplished.

## 2023-07-27 NOTE — Progress Notes

## 2023-07-27 NOTE — Patient Instructions
Discharge Instructions for Medial Branch Block    Important information following your procedure today: You may drive today    This injection is for diagnostic purposes, it is a test. Only short-term results are expected.    Though the procedure is generally safe and complications are rare, we do ask that you be aware of any of the following:   Any swelling, persistent redness, new bleeding, or drainage from the site of the injection.  You should not experience a severe headache.  You should not run a fever over 101 F.  New onset of sharp, severe back & or neck pain.  New onset of upper or lower extremity numbness or weakness.  New difficulty controlling bowel or bladder function after the injection.  New shortness of breath.    If any of these occur, please call to report this occurrence to Dr. Khan at [phone]. If you are calling after 4:00 p.m. or on weekends or holidays please call [phone] and ask to have the resident physician on call for the physician paged or go to your local emergency room.    Avoid application of direct heat, hot showers or hot tubs today.    Remain active today. Do the activities that would normally cause you pain. After the injection, you should get at least 80% relief for up to 2-4 hours.    Call back to report the results to a nurse tomorrow at Dr. Khan at [phone]. You may have to leave a message. Give your name and contact information and answer the following questions.  Did you get relief?  Percentage of relief?  How long did it last?    If you are a candidate, a scheduler will call you within the next week to schedule your next procedure. The nurse will call you back if your results were not as expected to discuss next steps in your treatment plan.      The following medications were used: Bupivicaine        If you are unable to keep your upcoming appointment, please notify the [facility] scheduler at [phone] at least 24 hours in advance.

## 2023-07-28 ENCOUNTER — Encounter: Admit: 2023-07-28 | Discharge: 2023-07-28 | Payer: Private Health Insurance - Indemnity

## 2023-07-28 DIAGNOSIS — G4733 Obstructive sleep apnea (adult) (pediatric): Secondary | ICD-10-CM

## 2023-07-28 DIAGNOSIS — M5481 Occipital neuralgia: Secondary | ICD-10-CM

## 2023-07-28 DIAGNOSIS — Z96643 Presence of artificial hip joint, bilateral: Secondary | ICD-10-CM

## 2023-07-28 DIAGNOSIS — F17210 Nicotine dependence, cigarettes, uncomplicated: Secondary | ICD-10-CM

## 2023-07-28 DIAGNOSIS — I10 Essential (primary) hypertension: Secondary | ICD-10-CM

## 2023-07-28 DIAGNOSIS — M47812 Spondylosis without myelopathy or radiculopathy, cervical region: Secondary | ICD-10-CM

## 2023-07-28 DIAGNOSIS — Z8249 Family history of ischemic heart disease and other diseases of the circulatory system: Secondary | ICD-10-CM

## 2023-07-28 NOTE — Progress Notes
[id number] AMB SPINE INJECT MBB CERV/THOR  Procedure: medial branch block     Laterality: right     Location: - C2 and C3     The procedure yesterday was 80-85% effective and lasted approximately 6 hours.

## 2023-08-04 ENCOUNTER — Encounter: Admit: 2023-08-04 | Discharge: 2023-08-04 | Payer: Private Health Insurance - Indemnity

## 2023-08-10 ENCOUNTER — Encounter: Admit: 2023-08-10 | Discharge: 2023-08-10 | Payer: Private Health Insurance - Indemnity

## 2023-08-10 ENCOUNTER — Ambulatory Visit: Admit: 2023-08-10 | Discharge: 2023-08-10 | Payer: Private Health Insurance - Indemnity

## 2023-08-10 DIAGNOSIS — K5792 Diverticulitis of intestine, part unspecified, without perforation or abscess without bleeding: Secondary | ICD-10-CM

## 2023-08-10 DIAGNOSIS — G4733 Obstructive sleep apnea (adult) (pediatric): Secondary | ICD-10-CM

## 2023-08-10 DIAGNOSIS — I10 Essential (primary) hypertension: Secondary | ICD-10-CM

## 2023-08-10 DIAGNOSIS — M47812 Spondylosis without myelopathy or radiculopathy, cervical region: Secondary | ICD-10-CM

## 2023-08-10 DIAGNOSIS — F419 Anxiety disorder, unspecified: Secondary | ICD-10-CM

## 2023-08-10 DIAGNOSIS — Z8249 Family history of ischemic heart disease and other diseases of the circulatory system: Secondary | ICD-10-CM

## 2023-08-10 DIAGNOSIS — R519 Generalized headaches: Secondary | ICD-10-CM

## 2023-08-10 DIAGNOSIS — G629 Polyneuropathy, unspecified: Secondary | ICD-10-CM

## 2023-08-10 DIAGNOSIS — K219 Gastro-esophageal reflux disease without esophagitis: Secondary | ICD-10-CM

## 2023-08-10 DIAGNOSIS — M542 Cervicalgia: Secondary | ICD-10-CM

## 2023-08-10 DIAGNOSIS — K5732 Diverticulitis of large intestine without perforation or abscess without bleeding: Secondary | ICD-10-CM

## 2023-08-10 DIAGNOSIS — R531 Weakness: Secondary | ICD-10-CM

## 2023-08-10 DIAGNOSIS — M5481 Occipital neuralgia: Secondary | ICD-10-CM

## 2023-08-10 DIAGNOSIS — G43809 Other migraine, not intractable, without status migrainosus: Secondary | ICD-10-CM

## 2023-08-10 MED ORDER — BUPIVACAINE (PF) 0.5 % (5 MG/ML) IJ SOLN
1 mL | Freq: Once | INTRAMUSCULAR | 0 refills | Status: CP
Start: 2023-08-10 — End: ?
  Administered 2023-08-10: 15:00:00 1 mL via INTRAMUSCULAR

## 2023-08-10 NOTE — Progress Notes
[facility]ENTER  INTERVENTIONAL PAIN PROCEDURE HISTORY AND PHYSICAL    Chief Complaint: Pain    HISTORY OF PRESENT ILLNESS:  neck pain    Past Medical History:   Diagnosis Date    Acid reflux 07/18/2019    Anxiety 07/18/2019    Cervicalgia     Diverticulitis     Diverticulitis of colon (without mention of hemorrhage)(562.11) 12/04/2021    Family history of premature CAD 07/18/2019    Generalized headaches 2022    Hypertension 07/18/2019    Neuropathy     Occipital neuralgia     OSA on CPAP 07/18/2019    Weakness        Surgical History:   Procedure Laterality Date    EAR SURGERY      HIP REPLACEMENT Bilateral        family history includes Heart Attack (age of onset: 95) in his father and paternal grandfather; Heart Disease in his father and paternal grandfather; Hypertension in his father and paternal grandfather; Irritable Bowel Disease in his mother.    Social History     Socioeconomic History    Marital status: Single   Tobacco Use    Smoking status: Every Day     Current packs/day: 1.50     Average packs/day: 1.5 packs/day for 5.0 years (7.5 ttl pk-yrs)     Types: Cigarettes    Smokeless tobacco: Former     Types: Chew   Substance and Sexual Activity    Alcohol use: Yes     Alcohol/week: 12.0 standard drinks of alcohol     Types: 12 Cans of beer per week     Comment: [place] and Beer Daily    Drug use: Never    Sexual activity: Not Currently     Partners: Female       No Known Allergies    Vitals:    08/10/23 1000   BP: 127/86   BP Source: Arm, Right Upper   Pulse: 119   Temp: 37.3 ?C (99.1 ?F)   Resp: 16   SpO2: 97%   TempSrc: Oral   PainSc: Five   Weight: 68 kg (150 lb)   Height: 180.3 cm (5' 11)     Pain Score: Five  Oswestry Total Score:: 42    REVIEW OF SYSTEMS: 10 point ROS obtained and negative except pain      PHYSICAL EXAM:unchnaged        IMPRESSION:    1. Cervical spondylosis    2. Cervicogenic migraine         PLAN: Other rt cerv mbb

## 2023-08-10 NOTE — Procedures
Attending Surgeon: Jerilynn Som, MD    Anesthesia: Local    Pre-Procedure Diagnosis:   1. Cervical spondylosis    2. Cervicogenic migraine        Post-Procedure Diagnosis:   1. Cervical spondylosis    2. Cervicogenic migraine        [place] AMB SPINE INJECT MBB CERV/THOR  Procedure: medial branch block    Laterality: right    Location: - C2 and C3      Consent:   Consent obtained: written  Consent given by: patient  Risks discussed: allergic reaction, bleeding, bruising, infection, nerve damage, reaction to medication, no change or worsening in pain, weakness and seizure  Alternatives discussed: alternative treatment, delayed treatment, no treatment and referral  Discussed with patient the purpose of the treatment/procedure, other ways of treating my condition, including no treatment/ procedure and the risks and benefits of the alternatives. Patient has decided to proceed with treatment/procedure.   Patient has had previous Medial Branch Block with greater than 80% relief that lasted more than 1 hour which is consistent with the local agent used.    Procedures Details:   Indications: pain, diagnostic evaluation and Axial Pain   Prep: chlorhexidine  Patient position: supine  Estimated Blood Loss: minimal  Specimens: none  Number of Levels: 1  Guidance: fluoroscopy  Needle and Epidural Catheter: pencil-tip  Needle size: 25 G  Injection procedure: Incremental injection, Negative aspiration for blood and Introduced needle  Patient tolerance: Patient tolerated the procedure well with no immediate complications. Pressure was applied, and hemostasis was accomplished.  Outcome: Pain relieved and Pain improved  Comments: .5ml bupi inj at each level      Estimated blood loss: none or minimal  Specimens: none  Patient tolerated the procedure well with no immediate complications. Pressure was applied, and hemostasis was accomplished.

## 2023-08-10 NOTE — Progress Notes

## 2023-08-10 NOTE — Patient Instructions
Discharge Instructions for Medial Branch Block    Important information following your procedure today: You may drive today    This injection is for diagnostic purposes, it is a test. Only short-term results are expected.    Though the procedure is generally safe and complications are rare, we do ask that you be aware of any of the following:   Any swelling, persistent redness, new bleeding, or drainage from the site of the injection.  You should not experience a severe headache.  You should not run a fever over 101 F.  New onset of sharp, severe back & or neck pain.  New onset of upper or lower extremity numbness or weakness.  New difficulty controlling bowel or bladder function after the injection.  New shortness of breath.    If any of these occur, please call to report this occurrence to Dr. Khan at [phone]. If you are calling after 4:00 p.m. or on weekends or holidays please call [phone] and ask to have the resident physician on call for the physician paged or go to your local emergency room.    Avoid application of direct heat, hot showers or hot tubs today.    Remain active today. Do the activities that would normally cause you pain. After the injection, you should get at least 80% relief for up to 2-4 hours.    Call back to report the results to a nurse tomorrow at Dr. Khan at [phone]. You may have to leave a message. Give your name and contact information and answer the following questions.  Did you get relief?  Percentage of relief?  How long did it last?    If you are a candidate, a scheduler will call you within the next week to schedule your next procedure. The nurse will call you back if your results were not as expected to discuss next steps in your treatment plan.      The following medications were used: Bupivicaine        If you are unable to keep your upcoming appointment, please notify the [facility] scheduler at [phone] at least 24 hours in advance.

## 2023-08-15 ENCOUNTER — Encounter: Admit: 2023-08-15 | Discharge: 2023-08-15 | Payer: Private Health Insurance - Indemnity

## 2023-08-21 ENCOUNTER — Encounter: Admit: 2023-08-21 | Discharge: 2023-08-21 | Payer: Private Health Insurance - Indemnity

## 2023-08-21 DIAGNOSIS — M47812 Spondylosis without myelopathy or radiculopathy, cervical region: Secondary | ICD-10-CM

## 2023-08-29 ENCOUNTER — Encounter: Admit: 2023-08-29 | Discharge: 2023-08-29 | Payer: Private Health Insurance - Indemnity

## 2023-08-29 DIAGNOSIS — E538 Deficiency of other specified B group vitamins: Secondary | ICD-10-CM

## 2023-08-29 LAB — VITAMIN B12: VITAMIN B12: 253 pg/mL (ref 213–816)

## 2023-08-29 MED ORDER — PHYSICIANS EZ USE B-12 1,000 MCG/ML IJ KIT
ORAL | 0 refills | 30.00000 days | Status: AC
Start: 2023-08-29 — End: ?

## 2023-08-31 ENCOUNTER — Ambulatory Visit: Admit: 2023-08-31 | Discharge: 2023-08-31 | Payer: Private Health Insurance - Indemnity

## 2023-08-31 ENCOUNTER — Encounter: Admit: 2023-08-31 | Discharge: 2023-08-31 | Payer: Private Health Insurance - Indemnity

## 2023-08-31 DIAGNOSIS — K219 Gastro-esophageal reflux disease without esophagitis: Secondary | ICD-10-CM

## 2023-08-31 DIAGNOSIS — I10 Essential (primary) hypertension: Secondary | ICD-10-CM

## 2023-08-31 DIAGNOSIS — G4733 Obstructive sleep apnea (adult) (pediatric): Secondary | ICD-10-CM

## 2023-08-31 DIAGNOSIS — R519 Generalized headaches: Secondary | ICD-10-CM

## 2023-08-31 DIAGNOSIS — M47812 Spondylosis without myelopathy or radiculopathy, cervical region: Secondary | ICD-10-CM

## 2023-08-31 DIAGNOSIS — K5792 Diverticulitis of intestine, part unspecified, without perforation or abscess without bleeding: Secondary | ICD-10-CM

## 2023-08-31 DIAGNOSIS — M5481 Occipital neuralgia: Secondary | ICD-10-CM

## 2023-08-31 DIAGNOSIS — Z8249 Family history of ischemic heart disease and other diseases of the circulatory system: Secondary | ICD-10-CM

## 2023-08-31 DIAGNOSIS — M542 Cervicalgia: Secondary | ICD-10-CM

## 2023-08-31 DIAGNOSIS — K5732 Diverticulitis of large intestine without perforation or abscess without bleeding: Secondary | ICD-10-CM

## 2023-08-31 DIAGNOSIS — R531 Weakness: Secondary | ICD-10-CM

## 2023-08-31 DIAGNOSIS — F419 Anxiety disorder, unspecified: Secondary | ICD-10-CM

## 2023-08-31 DIAGNOSIS — G629 Polyneuropathy, unspecified: Secondary | ICD-10-CM

## 2023-08-31 MED ORDER — FENTANYL CITRATE (PF) 50 MCG/ML IJ SOLN
25-50 ug | INTRAVENOUS | 0 refills | Status: AC | PRN
Start: 2023-08-31 — End: ?
  Administered 2023-08-31 (×2): 50 ug via INTRAVENOUS
  Administered ????-??-??

## 2023-08-31 MED ORDER — MIDAZOLAM 1 MG/ML IJ SOLN
.5-1 mg | INTRAVENOUS | 0 refills | Status: AC | PRN
Start: 2023-08-31 — End: ?
  Administered 2023-08-31 (×3): 1 mg via INTRAVENOUS
  Administered ????-??-??

## 2023-08-31 MED ORDER — LIDOCAINE (PF) 20 MG/ML (2 %) IJ SOLN
5 mL | Freq: Once | INTRAMUSCULAR | 0 refills | Status: CP
Start: 2023-08-31 — End: ?
  Administered 2023-08-31: 15:00:00 100 mg via INTRAMUSCULAR
  Administered 2023-08-31: 15:00:00

## 2023-08-31 NOTE — Patient Instructions
Discharge Instructions for Radiofrequency Ablation    Important information following your procedure today: You may NOT drive today    Go directly home and rest. You may resume your regular activities and exercise tomorrow.   You may experience soreness at the injection site. Apply ice at 20 minute intervals for the next 24 hours. Avoid application of direct heat, hot showers or hot tubs today.  It is not uncommon to experience an increase in pain for several days and up to a week after the procedure.  Though the procedure is generally safe and complications are rare, we Jonathan Crosby ask that you be aware of any of the following:   Any swelling, persistent redness, new bleeding, or drainage from the site of the injection.  You should not experience a severe headache.  You should not run a fever over 101? F.  New onset of sharp, severe back & or neck pain.  New onset of upper or lower extremity numbness or weakness.  New difficulty controlling bowel or bladder function after the injection.  New shortness of breath.    If any of these occur, please call to report this occurrence to Dr. Welton Flakes at [phone]. If you are calling after 4:00 p.m. or on weekends and holidays please call [phone] and ask to have the resident physician on call for the physician paged or go to your local emergency room.  The beneficial effect from the radiofrequency procedure may take several weeks to be demonstrated.  Take medications as directed.   Please call the nurse at the number listed above with any questions.        The following medications were used: Lidocaine , Versed, and Fentanyl    If you are unable to keep your upcoming appointment, please notify the [facility] scheduler at [phone] at least 24 hours in advance.

## 2023-09-18 ENCOUNTER — Encounter: Admit: 2023-09-18 | Discharge: 2023-09-18 | Payer: Private Health Insurance - Indemnity

## 2023-10-18 ENCOUNTER — Encounter: Admit: 2023-10-18 | Discharge: 2023-10-18 | Payer: Private Health Insurance - Indemnity

## 2023-10-25 ENCOUNTER — Encounter: Admit: 2023-10-25 | Discharge: 2023-10-25 | Payer: Private Health Insurance - Indemnity

## 2023-10-26 ENCOUNTER — Encounter: Admit: 2023-10-26 | Discharge: 2023-10-26 | Payer: Private Health Insurance - Indemnity

## 2023-10-26 ENCOUNTER — Ambulatory Visit: Admit: 2023-10-26 | Discharge: 2023-10-27 | Payer: Private Health Insurance - Indemnity

## 2023-10-26 DIAGNOSIS — M47812 Spondylosis without myelopathy or radiculopathy, cervical region: Secondary | ICD-10-CM

## 2023-10-26 DIAGNOSIS — M5481 Occipital neuralgia: Secondary | ICD-10-CM

## 2023-11-16 ENCOUNTER — Encounter: Admit: 2023-11-16 | Discharge: 2023-11-16 | Payer: Private Health Insurance - Indemnity

## 2023-11-17 ENCOUNTER — Encounter: Admit: 2023-11-17 | Discharge: 2023-11-17 | Payer: Private Health Insurance - Indemnity

## 2023-11-17 NOTE — Telephone Encounter
Pt updated on insurance auth. P2P scheduled for 11/20/23

## 2023-11-20 ENCOUNTER — Encounter: Admit: 2023-11-20 | Discharge: 2023-11-20 | Payer: Private Health Insurance - Indemnity

## 2023-11-20 NOTE — Telephone Encounter
P2P with Dr Yetta Barre   Not a covered service/investigational   Can appeal a letter of medical necessity     Acct # [id number]  Fax appeal to [phone]

## 2023-11-23 ENCOUNTER — Encounter: Admit: 2023-11-23 | Discharge: 2023-11-23 | Payer: Private Health Insurance - Indemnity

## 2023-11-23 DIAGNOSIS — M5481 Occipital neuralgia: Secondary | ICD-10-CM

## 2023-11-23 DIAGNOSIS — M791 Myalgia, unspecified site: Secondary | ICD-10-CM

## 2023-11-23 DIAGNOSIS — G43809 Other migraine, not intractable, without status migrainosus: Secondary | ICD-10-CM

## 2023-12-05 ENCOUNTER — Ambulatory Visit: Admit: 2023-12-05 | Discharge: 2023-12-06 | Payer: Private Health Insurance - Indemnity

## 2023-12-05 ENCOUNTER — Encounter: Admit: 2023-12-05 | Discharge: 2023-12-05 | Payer: Private Health Insurance - Indemnity

## 2023-12-12 ENCOUNTER — Encounter: Admit: 2023-12-12 | Discharge: 2023-12-12 | Payer: Private Health Insurance - Indemnity

## 2023-12-15 ENCOUNTER — Encounter: Admit: 2023-12-15 | Discharge: 2023-12-15 | Payer: Private Health Insurance - Indemnity

## 2024-01-02 ENCOUNTER — Encounter: Admit: 2024-01-02 | Discharge: 2024-01-02 | Payer: Private Health Insurance - Indemnity

## 2024-01-02 NOTE — Telephone Encounter
01/02/2024 Records request faxed to PCP Steva Ready, MD (F) [phone], no cardiac records outside [place] per decision tree. sdc

## 2024-01-03 ENCOUNTER — Encounter: Admit: 2024-01-03 | Discharge: 2024-01-03 | Payer: Private Health Insurance - Indemnity

## 2024-01-09 ENCOUNTER — Encounter: Admit: 2024-01-09 | Discharge: 2024-01-09 | Payer: Private Health Insurance - Indemnity

## 2024-01-09 DIAGNOSIS — M5481 Occipital neuralgia: Secondary | ICD-10-CM

## 2024-01-09 MED ORDER — NORTRIPTYLINE 25 MG PO CAP
50 mg | ORAL_CAPSULE | Freq: Every evening | ORAL | 2 refills | Status: AC
Start: 2024-01-09 — End: ?

## 2024-01-18 ENCOUNTER — Encounter: Admit: 2024-01-18 | Discharge: 2024-01-18 | Payer: Private Health Insurance - Indemnity

## 2024-01-22 ENCOUNTER — Encounter: Admit: 2024-01-22 | Discharge: 2024-01-22 | Payer: Private Health Insurance - Indemnity

## 2024-01-23 ENCOUNTER — Ambulatory Visit: Admit: 2024-01-23 | Discharge: 2024-01-24 | Payer: Private Health Insurance - Indemnity

## 2024-01-23 ENCOUNTER — Encounter: Admit: 2024-01-23 | Discharge: 2024-01-23 | Payer: Private Health Insurance - Indemnity

## 2024-01-23 DIAGNOSIS — Z136 Encounter for screening for cardiovascular disorders: Secondary | ICD-10-CM

## 2024-01-23 DIAGNOSIS — Z720 Tobacco use: Secondary | ICD-10-CM

## 2024-01-23 DIAGNOSIS — F109 Chronic alcohol use: Secondary | ICD-10-CM

## 2024-01-23 DIAGNOSIS — I10 Essential (primary) hypertension: Secondary | ICD-10-CM

## 2024-01-23 DIAGNOSIS — I209 Angina pectoris, unspecified: Secondary | ICD-10-CM

## 2024-01-23 DIAGNOSIS — E7801 Familial hypercholesterolemia: Secondary | ICD-10-CM

## 2024-01-23 DIAGNOSIS — G43809 Other migraine, not intractable, without status migrainosus: Secondary | ICD-10-CM

## 2024-01-23 DIAGNOSIS — G4733 Obstructive sleep apnea (adult) (pediatric): Secondary | ICD-10-CM

## 2024-01-23 NOTE — Assessment & Plan Note
He uses a cane and has chronic back pain--I ordered a reg/thal to evaluate his ongoing angina symptoms.

## 2024-01-23 NOTE — Assessment & Plan Note
1 and a half packs/day currently!

## 2024-01-23 NOTE — Assessment & Plan Note
He sees a neurologist at [facility], Dr. Seleta Rhymes, regarding occipital neuralgia.  Dr. Welton Flakes is his pain management specialist.

## 2024-01-23 NOTE — Progress Notes
Date of Service: 01/23/2024    Jonathan Crosby is a 58 y.o. male.       HPI     Jonathan Crosby is an unfortunate 58 y.o. who lives here in [place].  I'd seen him once back in 2020.  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    He has familial hypercholesterolemia and smokes over a pack/day.  He has exertional angina symptoms and goes to the [facility] ED occaisonally for chest pain evaluations.    He denies stroke or syncope symptoms.  He doesn't have claudication or TIA symptoms.         Vitals:    01/23/24 0857   BP: (!) 142/82   BP Source: Arm, Left Upper   Pulse: 104   SpO2: 98%   O2 Device: None (Room air)   PainSc: Two   Weight: 68.1 kg (150 lb 3.2 oz)   Height: 182.9 cm (6')     Body mass index is 20.37 kg/m?Marland Kitchen     Past Medical History  Patient Active Problem List    Diagnosis Date Noted    Tobacco abuse 01/23/2024    Cervicogenic migraine 01/18/2023    Bilateral numbness and tingling of arms and legs 12/28/2021    Angina pectoris (HCC) 07/18/2019     10/18/2015  Exercise stress test:  No exercise induced chest pain.  No ECG evidence of ischemia.  Low risk study.        Chronic alcohol use 07/18/2019    Hypertension 07/18/2019     04/11/2012 Stress Echo:  No evidence of ischemia.  No echocardiographic evidence of ischemia.  Medical treatment with lifestyle and risk factor modifications.       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   HENT: Negative.     Eyes: Negative.    Cardiovascular:  Positive for chest pain.   Respiratory: Negative.     Endocrine: Negative.    Hematologic/Lymphatic: Negative.    Skin: Negative.    Musculoskeletal: Negative.    Gastrointestinal: Negative.    Genitourinary: Negative.    Neurological: Negative.    Psychiatric/Behavioral: Negative.     Allergic/Immunologic: Negative. Physical Exam    Physical Exam   General Appearance: no distress, smells of alcohol   Skin: warm, no ulcers or xanthomas   Digits and Nails: no cyanosis or clubbing   Eyes: conjunctivae and lids normal, pupils are equal and round   Teeth/Gums/Palate: dentition unremarkable, no lesions   Lips & Oral Mucosa: no pallor or cyanosis   Neck Veins: normal JVP , neck veins are not distended   Thyroid: no nodules, masses, tenderness or enlargement   Chest Inspection: chest is normal in appearance   Respiratory Effort: breathing comfortably, no respiratory distress   Auscultation/Percussion: lungs clear to auscultation, no rales or rhonchi, no wheezing   PMI: PMI not enlarged or displaced   Cardiac Rhythm: regular rhythm and normal rate   Cardiac Auscultation: S1, S2 normal, no rub, no gallop   Murmurs: no murmur   Peripheral Circulation: normal peripheral circulation   Carotid Arteries: normal carotid upstroke bilaterally, no bruits   Radial Arteries: normal symmetric radial pulses   Abdominal Aorta: no abdominal aortic bruit   Pedal Pulses: normal symmetric  pedal pulses   Lower Extremity Edema: no lower extremity edema   Abdominal Exam: soft, non-tender, no masses, bowel sounds normal   Liver & Spleen: no organomegaly   Gait & Station: walks without assistance   Muscle Strength: normal muscle tone   Orientation: oriented to time, place and person   Affect & Mood: appropriate and sustained affect   Language and Memory: patient responsive and seems to comprehend information   Neurologic Exam: neurological assessment grossly intact   Other: moves all extremities      Cardiovascular Studies    EKG:  SR, rate 97.  Normal EKG    Cardiovascular Health Factors  Vitals BP Readings from Last 3 Encounters:   01/23/24 (!) 142/82   12/05/23 134/79   10/26/23 (!) 158/98     Wt Readings from Last 3 Encounters:   01/23/24 68.1 kg (150 lb 3.2 oz)   12/05/23 69.4 kg (153 lb)   10/26/23 68 kg (150 lb)     BMI Readings from Last 3 Encounters:   01/23/24 20.37 kg/m?   12/05/23 20.75 kg/m?   10/26/23 20.92 kg/m?      Smoking Social History     Tobacco Use   Smoking Status Every Day    Current packs/day: 1.50    Average packs/day: 1.5 packs/day for 5.0 years (7.5 ttl pk-yrs)    Types: Cigarettes   Smokeless Tobacco Former    Types: Chew      Lipid Profile Cholesterol   Date Value Ref Range Status   08/20/2019 226 (H) <200 Final     HDL   Date Value Ref Range Status   08/20/2019 44  Final     LDL   Date Value Ref Range Status   08/20/2019 168 (H) <100 Final     Triglycerides   Date Value Ref Range Status   08/20/2019 71  Final      Blood Sugar No results found for: HGBA1C  Glucose   Date Value Ref Range Status   12/25/2023 79  Final   06/03/2019 96  Final          Problems Addressed Today  Encounter Diagnoses   Name Primary?    Screening for heart disease Yes    OSA on CPAP     Cervicogenic migraine     Chronic alcohol use     Tobacco abuse     Primary hypertension     Familial hypercholesterolemia     Angina pectoris (HCC)        Assessment and Plan       OSA on CPAP  He's been on CPAP for about 10 years.    Cervicogenic migraine  He sees a neurologist at [facility], Dr. Seleta Crosby, regarding occipital neuralgia.  Dr. Welton Crosby is his pain management specialist.    Chronic alcohol use  Binge drinking pattern--off for a couple of weeks and then binging.  Still drinking.    Tobacco abuse  1 and a half packs/day currently!    Hypertension  Lisinopril 40 mg/day & nifedipine XL 30 mg/day.    He has a home BP monitor--he doesn't use it regularly.    I think that a beta-blocker might be a better option than the nifedipine--probably something like nebivolol 2.5-5 mg/day.    Familial hypercholesterolemia  Lab Results   Component Value Date    CHOL 226 (H) 08/20/2019    TRIG 71 08/20/2019    HDL 44 08/20/2019    LDL 168 (H) 08/20/2019  VLDL 14 08/20/2019    CHOLHDLC 5 08/20/2019      I will order an updated lipid profile.    Angina pectoris (HCC)  He uses a cane and has chronic back pain--I ordered a reg/thal to evaluate his ongoing angina symptoms.      Current Medications (including today's revisions)   cyanocobalamin (vitamin B-12) (PHYSICIANS EZ USE B-12) 1,000 mcg/mL kit Inject IM weekly for 4 weeks, then monthly for 6 months.  Indications: inadequate vitamin B12    cyanocobalamin (vitamin B-12) (PHYSICIANS EZ USE B-12) 1,000 mcg/mL kit Inject 1 ml into muscle daily for 6 days, then weekly for 4 weeks, then monthly for 6 months.  Indications: inadequate vitamin B12 (Patient not taking: Reported on 01/23/2024)    gabapentin (NEURONTIN) 400 mg capsule Take one capsule by mouth three times daily.    HYDROcodone/acetaminophen (NORCO) 7.5/325 mg tablet TAKE ONE TABLET BY MOUTH TWICE DAILY AS NEEDED FOR PAIN. Take in place of ibuprofen, not in addition to FOR ITCHING. Caution with lorazepam.    IBUPROFEN PO Take  by mouth.    lisinopriL (ZESTRIL) 40 mg tablet Take one tablet by mouth daily.    LORazepam (ATIVAN) 2 mg tablet Take one tablet by mouth three times daily as needed for Nausea.    NIFEdipine XL (PROCARDIA-XL) 30 mg tablet Take one tablet by mouth daily.    nortriptyline (PAMELOR) 25 mg capsule Take two capsules by mouth at bedtime daily. SCHEDULE FOLLOW UP APPOINTMENT    pantoprazole DR (PROTONIX) 40 mg tablet Take one tablet by mouth twice daily.    tadalafiL (CIALIS) 20 mg tablet Take one tablet by mouth as Needed for Erectile dysfunction.    testosterone (ANDROGEL PUMP) 1.62 % (20.25 mg/1.25 g) transdermal gel Daily    thiamine HCL (VITAMIN B-1) 100 mg tablet Take one tablet by mouth twice daily.    traMADoL (ULTRAM) 50 mg tablet Take one tablet to two tablets by mouth every 6-8 hours as needed for Pain.     Total time spent on today's office visit was 45 minutes.  This includes face-to-face in person visit with patient as well as nonface-to-face time including review of the EMR, outside records, labs, radiologic studies, echocardiogram & other cardiovascular studies, formation of treatment plan, after visit summary, future disposition, and lastly on documentation.

## 2024-01-23 NOTE — Assessment & Plan Note
He's been on CPAP for about 10 years.

## 2024-01-23 NOTE — Assessment & Plan Note
Lab Results   Component Value Date    CHOL 226 (H) 08/20/2019    TRIG 71 08/20/2019    HDL 44 08/20/2019    LDL 168 (H) 08/20/2019    VLDL 14 08/20/2019    CHOLHDLC 5 08/20/2019      I will order an updated lipid profile.

## 2024-01-23 NOTE — Assessment & Plan Note
Binge drinking pattern--off for a couple of weeks and then binging.  Still drinking.

## 2024-02-04 ENCOUNTER — Encounter: Admit: 2024-02-04 | Discharge: 2024-02-04 | Payer: Private Health Insurance - Indemnity

## 2024-02-05 ENCOUNTER — Encounter: Admit: 2024-02-05 | Discharge: 2024-02-05 | Payer: Private Health Insurance - Indemnity

## 2024-02-05 DIAGNOSIS — M47812 Spondylosis without myelopathy or radiculopathy, cervical region: Secondary | ICD-10-CM

## 2024-02-05 DIAGNOSIS — M5481 Occipital neuralgia: Secondary | ICD-10-CM

## 2024-02-05 NOTE — Progress Notes
 Increased neck pain   Has treid conservative TPI nerve blocks and RFA with mild relief   Has been on multi-meds, ibuprofen, elavil, tramadol, HCD, gabapentin   Pain is persisting   Last MRI in 2022   Plan new MRI and f.u to eval for surgical vs other trx options.

## 2024-02-08 ENCOUNTER — Encounter: Admit: 2024-02-08 | Discharge: 2024-02-08 | Payer: Private Health Insurance - Indemnity

## 2024-02-20 ENCOUNTER — Encounter: Admit: 2024-02-20 | Discharge: 2024-02-20 | Payer: Private Health Insurance - Indemnity

## 2024-02-22 ENCOUNTER — Encounter: Admit: 2024-02-22 | Discharge: 2024-02-22 | Payer: Private Health Insurance - Indemnity

## 2024-02-22 MED ORDER — PREGABALIN 25 MG PO CAP
ORAL_CAPSULE | ORAL | 0 refills | Status: AC
Start: 2024-02-22 — End: ?

## 2024-03-11 ENCOUNTER — Encounter: Admit: 2024-03-11 | Discharge: 2024-03-11

## 2024-03-11 NOTE — Telephone Encounter
 Jonathan Crosby with [facility] called and advised that they need add'l information for C-spine MRI order that was rec'd. Asked for clinicals to be faxed to [phone].   Per [facility]- fax would not go through, and all clinicals were emailed on Fri 03/08/24.   RN called Eileen Stanford back to confirm that everything has been rec'd and she stated yes.

## 2024-03-21 ENCOUNTER — Encounter: Admit: 2024-03-21 | Discharge: 2024-03-21 | Payer: PRIVATE HEALTH INSURANCE

## 2024-03-22 ENCOUNTER — Encounter: Admit: 2024-03-22 | Discharge: 2024-03-22 | Payer: PRIVATE HEALTH INSURANCE

## 2024-03-26 ENCOUNTER — Encounter: Admit: 2024-03-26 | Discharge: 2024-03-26 | Payer: PRIVATE HEALTH INSURANCE

## 2024-03-28 ENCOUNTER — Encounter: Admit: 2024-03-28 | Discharge: 2024-03-28 | Payer: PRIVATE HEALTH INSURANCE

## 2024-03-28 ENCOUNTER — Ambulatory Visit: Admit: 2024-03-28 | Discharge: 2024-03-29 | Payer: PRIVATE HEALTH INSURANCE

## 2024-03-28 DIAGNOSIS — M5481 Occipital neuralgia: Secondary | ICD-10-CM

## 2024-03-28 MED ORDER — PREGABALIN 50 MG PO CAP
50 mg | ORAL_CAPSULE | Freq: Two times a day (BID) | ORAL | 3 refills | Status: AC
Start: 2024-03-28 — End: ?

## 2024-03-28 MED ORDER — PREGABALIN 50 MG PO CAP
50 mg | ORAL_CAPSULE | Freq: Two times a day (BID) | ORAL | 0 refills | Status: DC
Start: 2024-03-28 — End: 2024-03-28

## 2024-03-28 NOTE — Progress Notes
 SPINE [facility]ENTER CLINIC NOTE       SUBJECTIVE:   Jonathan Crosby is a 58 y.o. male who  has a past medical history of Acid reflux (07/18/2019), Anxiety (07/18/2019), Cataract (Recently), Cervicalgia, Chest pain, Diverticulitis, Diverticulitis of colon (without mention of hemorrhage)(562.11) (12/04/2021), Family history of premature CAD (07/18/2019), Generalized headaches (2022), Hypertension (07/18/2019), Joint pain (2 years ago), Nerve injury, Neuropathy, Occipital neuralgia, OSA on CPAP (07/18/2019), Spinal headache, Syncope and collapse, Tachyarrhythmia, and Weakness.    Neck pain/head pain   Occipital right sided pain   Radiates up the head in greater occipital distribution   RFA has helped for approximately 3 weeks   Occipital nerve blocks helped for approximately 3-4 days   He does notice being able to take less HCD   Acknowledges excess alcohol and tobacco use: 5-6 drinks per day      Lumbar spine pain  Aching low back pain  Losing balance frequently   20+ falls in the past week       TRX   Right C2-3 RFA 08/31/23 with >50% relief so far, has been able to reduce meds by 50% and improved function - only lasted 3 weeks   C2-3 MBB x 2 in Aug 2024 > 80% for 5 hours  OCNB right 03/2023 - relief for 3-4 days   Formal PT at first of the year and Home stretches ongoing in the last 3 mo     MEDS   Gabapentin 400 TID   Nortriptyline 25mg  (advised to increase but has taken consistently at higher dose started)   Pregabalin 50 mg nightly   Hydrocodone Q12 - prescribed by PCP       Review of Systems    Current Outpatient Medications:     cyanocobalamin (vitamin B-12) (PHYSICIANS EZ USE B-12) 1,000 mcg/mL kit, Inject IM 1000mcg weekly for 4 weeks, then monthly for 6 months.  Indications: inadequate vitamin B12, Disp: 10 mL, Rfl: 0    cyanocobalamin (vitamin B-12) (PHYSICIANS EZ USE B-12) 1,000 mcg/mL kit, Inject 1 ml into muscle daily for 6 days, then weekly for 4 weeks, then monthly for 6 months.  Indications: inadequate vitamin B12 (Patient not taking: Reported on 01/23/2024), Disp: 16 mL, Rfl: 0    HYDROcodone/acetaminophen (NORCO) 7.5/325 mg tablet, TAKE ONE TABLET BY MOUTH TWICE DAILY AS NEEDED FOR PAIN. Take in place of ibuprofen, not in addition to FOR ITCHING. Caution with lorazepam., Disp: , Rfl:     IBUPROFEN PO, Take  by mouth., Disp: , Rfl:     lisinopriL (ZESTRIL) 40 mg tablet, Take one tablet by mouth daily., Disp: , Rfl:     LORazepam (ATIVAN) 2 mg tablet, Take one tablet by mouth three times daily as needed for Nausea., Disp: , Rfl:     NIFEdipine XL (PROCARDIA-XL) 30 mg tablet, Take one tablet by mouth daily., Disp: , Rfl:     nortriptyline (PAMELOR) 25 mg capsule, Take two capsules by mouth at bedtime daily. SCHEDULE FOLLOW UP APPOINTMENT, Disp: 60 capsule, Rfl: 2    pantoprazole DR (PROTONIX) 40 mg tablet, Take one tablet by mouth twice daily., Disp: , Rfl:     pregabalin (LYRICA) 50 mg capsule, Take one capsule by mouth twice daily., Disp: 60 capsule, Rfl: 3    tadalafiL (CIALIS) 20 mg tablet, Take one tablet by mouth as Needed for Erectile dysfunction., Disp: , Rfl:     testosterone (ANDROGEL PUMP) 1.62 % (20.25 mg/1.25 g) transdermal gel, Daily, Disp: , Rfl:  thiamine HCL (VITAMIN B-1) 100 mg tablet, Take one tablet by mouth twice daily., Disp: , Rfl:   No Known Allergies  Physical Exam  Vitals:    03/28/24 0846   BP: 115/86   BP Source: Arm, Left Upper   Pulse: 109   SpO2: 100%   PainSc: Five   Weight: 59.4 kg (131 lb)   Height: 180.3 cm (5' 11)     Oswestry Total Score:: (Patient-Rptd) 34  Pain Score: Five  Body mass index is 18.27 kg/m?[name]  General: Alert, oriented, mild distress  HEENT: normocephalic  NECK: TTP along c spine and paracerv muscles. Right occipital TTP with  mild allodynia to scalp  Resp: Non labored breathing, no distress  Cardio: Pedal pulses palpable bilaterally, mild lower extremity edema  MS: TTP in lumbar region of spine and paraspinal muscles.  NEURO: Cranial nerves II-XII intact. Motor strength adequate, sensory exams normal. Gait coordinated.   Behavior: Calm, cooperative, behavior and speech normal.  Anxious    Cervical spine:  Cervical tenderness: Yes  Occipital tenderness: Yes - right   Pain with extension: Yes  Pain with lateral flexion: Left  Limited neck ROM: Yes  Sensation to light touch: Intact and equal in the bilateral upper extremities  Hoffman sign: Negative bilaterally    L-Spine   There is mild paraspinal tenderness. Paraspinal muscle tone is normal.  Facet loading is negative bilaterally.   There is no tenderness or radiating pain with palpation over the SI joints, piriformis, or greater trochanteric bursae bilaterally.  FABER is negative.  ROM with flexion, extension, rotation, and lateral bending is intact.  Strength is equal and adequate bilaterally in the flexors and extensors of the bilateral lower extremities.   SLR is negative bilaterally.           IMPRESSION:  1. Cervical spondylosis    2. Occipital neuralgia of right side          PLAN:   Plan to begin formal physical therapy for cervical neck pain and traction.   A referral was placed to pain psychology to discuss coping strategies. We discussed his alcohol and tobacco use. He has been thinking about quitting both of these recently. He was informed that if he needs resources to aid in this, we are available or Dr. Kragenbrink may provide assistance.   Medications: discontinue gabapentin  entirely. Begin taking 50 mg of pregabalin  twice daily.   Future considerations include peripheral nerve stimulation .   Follow up in 3 months.

## 2024-03-29 ENCOUNTER — Encounter: Admit: 2024-03-29 | Discharge: 2024-03-29 | Payer: PRIVATE HEALTH INSURANCE

## 2024-03-29 DIAGNOSIS — M47812 Spondylosis without myelopathy or radiculopathy, cervical region: Secondary | ICD-10-CM

## 2024-04-10 ENCOUNTER — Encounter: Admit: 2024-04-10 | Discharge: 2024-04-10 | Payer: PRIVATE HEALTH INSURANCE

## 2024-04-22 ENCOUNTER — Encounter: Admit: 2024-04-22 | Discharge: 2024-04-22 | Payer: PRIVATE HEALTH INSURANCE

## 2024-05-01 ENCOUNTER — Encounter: Admit: 2024-05-01 | Discharge: 2024-05-01 | Payer: PRIVATE HEALTH INSURANCE

## 2024-05-01 DIAGNOSIS — M5481 Occipital neuralgia: Secondary | ICD-10-CM

## 2024-05-01 MED ORDER — NORTRIPTYLINE 25 MG PO CAP
50 mg | ORAL_CAPSULE | Freq: Every evening | ORAL | 2 refills
Start: 2024-05-01 — End: ?

## 2024-05-02 ENCOUNTER — Encounter: Admit: 2024-05-02 | Discharge: 2024-05-02 | Payer: PRIVATE HEALTH INSURANCE

## 2024-05-02 DIAGNOSIS — M5481 Occipital neuralgia: Secondary | ICD-10-CM

## 2024-05-02 MED ORDER — BD SAFETYGLIDE SHIELDING REG 1 ML 25 GAUGE X 5/8" MISC SYRG
0 refills
Start: 2024-05-02 — End: ?

## 2024-05-02 MED ORDER — NORTRIPTYLINE 25 MG PO CAP
50 mg | ORAL_CAPSULE | Freq: Every evening | ORAL | 2 refills
Start: 2024-05-02 — End: ?

## 2024-05-14 ENCOUNTER — Encounter: Admit: 2024-05-14 | Discharge: 2024-05-14 | Payer: PRIVATE HEALTH INSURANCE

## 2024-05-14 DIAGNOSIS — E7801 Familial hypercholesterolemia: Secondary | ICD-10-CM

## 2024-05-14 LAB — LIPID PROFILE
CHOLESTEROL/HDL %: 3
CHOLESTEROL: 174
HDL: 58
LDL: 104
TRIGLYCERIDES: 64
VLDL: 13

## 2024-05-14 LAB — TSH WITH FREE T4 REFLEX: THYROID STIMULATING HORMONE: 1.3 mU/L

## 2024-05-17 ENCOUNTER — Encounter: Admit: 2024-05-17 | Discharge: 2024-05-17 | Payer: PRIVATE HEALTH INSURANCE

## 2024-05-28 ENCOUNTER — Encounter: Admit: 2024-05-28 | Discharge: 2024-05-28 | Payer: PRIVATE HEALTH INSURANCE

## 2024-05-28 DIAGNOSIS — M47812 Spondylosis without myelopathy or radiculopathy, cervical region: Secondary | ICD-10-CM

## 2024-05-28 NOTE — Telephone Encounter
 Radiofrequency Ablation Checklist:    If repeat radiofrequency ablation:  50% improvement for at least 6 months [x]     50% consistent relief for at least 6 months [x]     Radiofrequency ablation completed on same level over 2 years ago  Yes Repeat MBB-RFA not indicated    RIGHT C2-C3 RFA-Patient reports 50 % relief for greater than 6 months. Patient reports ability to increase activity levels, perform ADL's with less hindrance and decrease need for OTC pain medication. Pt rates pain at 6/10 currently

## 2024-06-01 ENCOUNTER — Encounter: Admit: 2024-06-01 | Discharge: 2024-06-01 | Payer: PRIVATE HEALTH INSURANCE

## 2024-06-03 ENCOUNTER — Encounter: Admit: 2024-06-03 | Discharge: 2024-06-03 | Payer: PRIVATE HEALTH INSURANCE

## 2024-06-07 ENCOUNTER — Encounter: Admit: 2024-06-07 | Discharge: 2024-06-07 | Payer: PRIVATE HEALTH INSURANCE

## 2024-06-13 ENCOUNTER — Encounter: Admit: 2024-06-13 | Discharge: 2024-06-13 | Payer: PRIVATE HEALTH INSURANCE

## 2024-07-03 ENCOUNTER — Encounter: Admit: 2024-07-03 | Discharge: 2024-07-03 | Payer: PRIVATE HEALTH INSURANCE

## 2024-07-04 ENCOUNTER — Encounter: Admit: 2024-07-04 | Discharge: 2024-07-04 | Payer: PRIVATE HEALTH INSURANCE

## 2024-07-11 ENCOUNTER — Encounter: Admit: 2024-07-11 | Discharge: 2024-07-11 | Payer: PRIVATE HEALTH INSURANCE

## 2024-08-01 ENCOUNTER — Encounter: Admit: 2024-08-01 | Discharge: 2024-08-01 | Payer: PRIVATE HEALTH INSURANCE

## 2024-08-27 ENCOUNTER — Encounter: Admit: 2024-08-27 | Discharge: 2024-08-27 | Payer: PRIVATE HEALTH INSURANCE

## 2024-09-11 DEATH — deceased

## 2024-12-20 ENCOUNTER — Encounter: Admit: 2024-12-20 | Discharge: 2024-12-20 | Payer: PRIVATE HEALTH INSURANCE
